# Patient Record
Sex: Male | Born: 1990 | Race: White | Hispanic: No | Marital: Single | Smoking: Never smoker
Health system: Southern US, Community
[De-identification: ages and names within clinical notes are randomized; demographics above are authoritative.]

## PROBLEM LIST (undated history)

## (undated) DIAGNOSIS — C8190 Hodgkin lymphoma, unspecified, unspecified site: Principal | ICD-10-CM

## (undated) DIAGNOSIS — F419 Anxiety disorder, unspecified: Secondary | ICD-10-CM

## (undated) HISTORY — DX: Anxiety disorder, unspecified: F41.9

## (undated) HISTORY — DX: Hodgkin lymphoma, unspecified, unspecified site: C81.90

---
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
## 2011-02-01 ENCOUNTER — Other Ambulatory Visit: Payer: Self-pay | Admitting: Family Medicine

## 2011-02-01 ENCOUNTER — Ambulatory Visit
Admission: RE | Admit: 2011-02-01 | Discharge: 2011-02-01 | Disposition: A | Discharge status: Home / Self Care | Payer: Federal, State, Local not specified - PPO | Source: Ambulatory Visit | Attending: Family Medicine | Admitting: Family Medicine

## 2011-02-01 DIAGNOSIS — R221 Localized swelling, mass and lump, neck: Secondary | ICD-10-CM

## 2011-02-05 ENCOUNTER — Ambulatory Visit (HOSPITAL_COMMUNITY)
Admission: RE | Admit: 2011-02-05 | Discharge: 2011-02-05 | Disposition: A | Discharge status: Home / Self Care | Payer: Federal, State, Local not specified - PPO | Source: Ambulatory Visit | Attending: General Surgery | Admitting: General Surgery

## 2011-02-05 ENCOUNTER — Other Ambulatory Visit (HOSPITAL_COMMUNITY): Payer: Self-pay | Admitting: General Surgery

## 2011-02-05 ENCOUNTER — Other Ambulatory Visit: Payer: Self-pay | Admitting: Interventional Radiology

## 2011-02-05 DIAGNOSIS — R221 Localized swelling, mass and lump, neck: Secondary | ICD-10-CM | POA: Insufficient documentation

## 2011-02-05 DIAGNOSIS — R599 Enlarged lymph nodes, unspecified: Secondary | ICD-10-CM

## 2011-02-05 DIAGNOSIS — R220 Localized swelling, mass and lump, head: Secondary | ICD-10-CM | POA: Insufficient documentation

## 2011-02-12 ENCOUNTER — Other Ambulatory Visit: Payer: Self-pay | Admitting: Oncology

## 2011-02-12 ENCOUNTER — Encounter (HOSPITAL_BASED_OUTPATIENT_CLINIC_OR_DEPARTMENT_OTHER): Payer: Federal, State, Local not specified - PPO | Admitting: Oncology

## 2011-02-12 DIAGNOSIS — C8171 Other classical Hodgkin lymphoma, lymph nodes of head, face, and neck: Secondary | ICD-10-CM

## 2011-02-12 DIAGNOSIS — C8190 Hodgkin lymphoma, unspecified, unspecified site: Secondary | ICD-10-CM

## 2011-02-12 LAB — CBC WITH DIFFERENTIAL/PLATELET
Eosinophils Absolute: 0.1 10*3/uL (ref 0.0–0.5)
HCT: 41.4 % (ref 38.4–49.9)
LYMPH%: 13.3 % — ABNORMAL LOW (ref 14.0–49.0)
MCV: 81.6 fL (ref 79.3–98.0)
MONO#: 0.7 10*3/uL (ref 0.1–0.9)
MONO%: 12.5 % (ref 0.0–14.0)
NEUT#: 4.3 10*3/uL (ref 1.5–6.5)
NEUT%: 72.1 % (ref 39.0–75.0)
Platelets: 349 10*3/uL (ref 140–400)
RBC: 5.07 10*6/uL (ref 4.20–5.82)
WBC: 5.9 10*3/uL (ref 4.0–10.3)

## 2011-02-12 LAB — MORPHOLOGY

## 2011-02-13 ENCOUNTER — Other Ambulatory Visit: Payer: Self-pay | Admitting: Oncology

## 2011-02-13 DIAGNOSIS — C8190 Hodgkin lymphoma, unspecified, unspecified site: Secondary | ICD-10-CM

## 2011-02-13 LAB — BETA 2 MICROGLOBULIN, SERUM: Beta-2 Microglobulin: 2.45 mg/L — ABNORMAL HIGH (ref 1.01–1.73)

## 2011-02-13 LAB — HIV ANTIBODY (ROUTINE TESTING W REFLEX): HIV: NONREACTIVE

## 2011-02-13 LAB — COMPREHENSIVE METABOLIC PANEL
ALT: 19 U/L (ref 0–53)
AST: 21 U/L (ref 0–37)
Calcium: 9.8 mg/dL (ref 8.4–10.5)
Chloride: 101 mEq/L (ref 96–112)
Creatinine, Ser: 0.85 mg/dL (ref 0.40–1.50)
Potassium: 4.2 mEq/L (ref 3.5–5.3)

## 2011-02-15 ENCOUNTER — Encounter: Payer: Federal, State, Local not specified - PPO | Admitting: Oncology

## 2011-02-15 ENCOUNTER — Inpatient Hospital Stay (HOSPITAL_COMMUNITY)
Admission: RE | Admit: 2011-02-15 | Discharge: 2011-02-15 | Disposition: A | Discharge status: Home / Self Care | Payer: Federal, State, Local not specified - PPO | Source: Ambulatory Visit | Attending: Oncology | Admitting: Oncology

## 2011-02-15 ENCOUNTER — Other Ambulatory Visit (HOSPITAL_COMMUNITY): Payer: Federal, State, Local not specified - PPO

## 2011-02-15 ENCOUNTER — Encounter (HOSPITAL_COMMUNITY): Payer: Self-pay

## 2011-02-15 ENCOUNTER — Encounter (HOSPITAL_COMMUNITY)
Admission: RE | Admit: 2011-02-15 | Discharge: 2011-02-15 | Disposition: A | Discharge status: Home / Self Care | Payer: Federal, State, Local not specified - PPO | Source: Ambulatory Visit | Attending: Oncology | Admitting: Oncology

## 2011-02-15 DIAGNOSIS — C8190 Hodgkin lymphoma, unspecified, unspecified site: Secondary | ICD-10-CM

## 2011-02-15 DIAGNOSIS — C8198 Hodgkin lymphoma, unspecified, lymph nodes of multiple sites: Secondary | ICD-10-CM | POA: Insufficient documentation

## 2011-02-15 DIAGNOSIS — J320 Chronic maxillary sinusitis: Secondary | ICD-10-CM | POA: Insufficient documentation

## 2011-02-15 DIAGNOSIS — R599 Enlarged lymph nodes, unspecified: Secondary | ICD-10-CM | POA: Insufficient documentation

## 2011-02-15 DIAGNOSIS — Z79899 Other long term (current) drug therapy: Secondary | ICD-10-CM | POA: Insufficient documentation

## 2011-02-15 HISTORY — DX: Hodgkin lymphoma, unspecified, unspecified site: C81.90

## 2011-02-15 LAB — GLUCOSE, CAPILLARY: Glucose-Capillary: 94 mg/dL (ref 70–99)

## 2011-02-15 MED ORDER — IOHEXOL 300 MG/ML  SOLN
125.0000 mL | Freq: Once | INTRAMUSCULAR | Status: AC | PRN
  Administered 2011-02-15: 125 mL via INTRAVENOUS

## 2011-02-18 ENCOUNTER — Other Ambulatory Visit (HOSPITAL_COMMUNITY)
Admission: RE | Admit: 2011-02-18 | Discharge: 2011-02-18 | Disposition: A | Discharge status: Home / Self Care | Payer: Federal, State, Local not specified - PPO | Source: Ambulatory Visit | Attending: Oncology | Admitting: Oncology

## 2011-02-18 ENCOUNTER — Ambulatory Visit (HOSPITAL_COMMUNITY)
Admission: RE | Admit: 2011-02-18 | Discharge: 2011-02-18 | Disposition: A | Discharge status: Home / Self Care | Payer: Federal, State, Local not specified - PPO | Source: Ambulatory Visit | Attending: Oncology | Admitting: Oncology

## 2011-02-18 ENCOUNTER — Encounter (HOSPITAL_BASED_OUTPATIENT_CLINIC_OR_DEPARTMENT_OTHER): Payer: Federal, State, Local not specified - PPO | Admitting: Oncology

## 2011-02-18 ENCOUNTER — Other Ambulatory Visit: Payer: Self-pay | Admitting: Oncology

## 2011-02-18 DIAGNOSIS — I428 Other cardiomyopathies: Secondary | ICD-10-CM | POA: Insufficient documentation

## 2011-02-18 DIAGNOSIS — C8191 Hodgkin lymphoma, unspecified, lymph nodes of head, face, and neck: Secondary | ICD-10-CM

## 2011-02-18 DIAGNOSIS — C8190 Hodgkin lymphoma, unspecified, unspecified site: Secondary | ICD-10-CM | POA: Insufficient documentation

## 2011-02-19 ENCOUNTER — Ambulatory Visit (HOSPITAL_COMMUNITY)
Admission: RE | Admit: 2011-02-19 | Discharge: 2011-02-19 | Disposition: A | Discharge status: Home / Self Care | Payer: Federal, State, Local not specified - PPO | Source: Ambulatory Visit | Attending: Oncology | Admitting: Oncology

## 2011-02-19 ENCOUNTER — Other Ambulatory Visit: Payer: Self-pay | Admitting: Oncology

## 2011-02-19 DIAGNOSIS — C8190 Hodgkin lymphoma, unspecified, unspecified site: Secondary | ICD-10-CM

## 2011-02-19 DIAGNOSIS — Z808 Family history of malignant neoplasm of other organs or systems: Secondary | ICD-10-CM | POA: Insufficient documentation

## 2011-02-19 DIAGNOSIS — Z801 Family history of malignant neoplasm of trachea, bronchus and lung: Secondary | ICD-10-CM | POA: Insufficient documentation

## 2011-02-19 DIAGNOSIS — Z803 Family history of malignant neoplasm of breast: Secondary | ICD-10-CM | POA: Insufficient documentation

## 2011-02-19 DIAGNOSIS — C8191 Hodgkin lymphoma, unspecified, lymph nodes of head, face, and neck: Secondary | ICD-10-CM | POA: Insufficient documentation

## 2011-02-20 ENCOUNTER — Other Ambulatory Visit (HOSPITAL_COMMUNITY): Payer: Federal, State, Local not specified - PPO

## 2011-02-20 ENCOUNTER — Ambulatory Visit (HOSPITAL_COMMUNITY): Payer: Federal, State, Local not specified - PPO

## 2011-02-22 ENCOUNTER — Encounter (HOSPITAL_BASED_OUTPATIENT_CLINIC_OR_DEPARTMENT_OTHER): Payer: Federal, State, Local not specified - PPO | Admitting: Oncology

## 2011-02-22 ENCOUNTER — Other Ambulatory Visit (HOSPITAL_COMMUNITY): Payer: Federal, State, Local not specified - PPO

## 2011-02-22 ENCOUNTER — Other Ambulatory Visit: Payer: Self-pay | Admitting: Oncology

## 2011-02-22 DIAGNOSIS — C8191 Hodgkin lymphoma, unspecified, lymph nodes of head, face, and neck: Secondary | ICD-10-CM

## 2011-02-22 DIAGNOSIS — C8171 Other classical Hodgkin lymphoma, lymph nodes of head, face, and neck: Secondary | ICD-10-CM

## 2011-02-22 DIAGNOSIS — Z5111 Encounter for antineoplastic chemotherapy: Secondary | ICD-10-CM

## 2011-02-22 LAB — COMPREHENSIVE METABOLIC PANEL
AST: 17 U/L (ref 0–37)
Alkaline Phosphatase: 77 U/L (ref 39–117)
BUN: 14 mg/dL (ref 6–23)
Creatinine, Ser: 0.73 mg/dL (ref 0.40–1.50)
Glucose, Bld: 88 mg/dL (ref 70–99)
Total Bilirubin: 0.8 mg/dL (ref 0.3–1.2)

## 2011-02-22 LAB — CBC WITH DIFFERENTIAL/PLATELET
BASO%: 0.6 % (ref 0.0–2.0)
Basophils Absolute: 0 10*3/uL (ref 0.0–0.1)
EOS%: 0.5 % (ref 0.0–7.0)
HCT: 42 % (ref 38.4–49.9)
HGB: 14.4 g/dL (ref 13.0–17.1)
LYMPH%: 11.2 % — ABNORMAL LOW (ref 14.0–49.0)
MCH: 26.8 pg — ABNORMAL LOW (ref 27.2–33.4)
MCHC: 34.3 g/dL (ref 32.0–36.0)
MCV: 78.1 fL — ABNORMAL LOW (ref 79.3–98.0)
MONO%: 17.3 % — ABNORMAL HIGH (ref 0.0–14.0)
NEUT%: 70.4 % (ref 39.0–75.0)
lymph#: 0.7 10*3/uL — ABNORMAL LOW (ref 0.9–3.3)

## 2011-02-22 LAB — PHOSPHORUS: Phosphorus: 3.6 mg/dL (ref 2.3–4.6)

## 2011-02-23 ENCOUNTER — Encounter: Payer: Federal, State, Local not specified - PPO | Admitting: Oncology

## 2011-02-23 DIAGNOSIS — Z5189 Encounter for other specified aftercare: Secondary | ICD-10-CM

## 2011-02-27 ENCOUNTER — Other Ambulatory Visit: Payer: Self-pay | Admitting: Oncology

## 2011-02-27 ENCOUNTER — Encounter (HOSPITAL_BASED_OUTPATIENT_CLINIC_OR_DEPARTMENT_OTHER): Payer: Federal, State, Local not specified - PPO | Admitting: Oncology

## 2011-02-27 DIAGNOSIS — C8171 Other classical Hodgkin lymphoma, lymph nodes of head, face, and neck: Secondary | ICD-10-CM

## 2011-02-27 LAB — BASIC METABOLIC PANEL
CO2: 28 mEq/L (ref 19–32)
Calcium: 9.1 mg/dL (ref 8.4–10.5)
Glucose, Bld: 141 mg/dL — ABNORMAL HIGH (ref 70–99)
Potassium: 4.1 mEq/L (ref 3.5–5.3)
Sodium: 138 mEq/L (ref 135–145)

## 2011-02-27 LAB — LACTATE DEHYDROGENASE: LDH: 158 U/L (ref 94–250)

## 2011-03-08 ENCOUNTER — Encounter (HOSPITAL_BASED_OUTPATIENT_CLINIC_OR_DEPARTMENT_OTHER): Payer: Federal, State, Local not specified - PPO | Admitting: Oncology

## 2011-03-08 ENCOUNTER — Other Ambulatory Visit: Payer: Self-pay | Admitting: Oncology

## 2011-03-08 DIAGNOSIS — C8191 Hodgkin lymphoma, unspecified, lymph nodes of head, face, and neck: Secondary | ICD-10-CM

## 2011-03-08 DIAGNOSIS — R112 Nausea with vomiting, unspecified: Secondary | ICD-10-CM

## 2011-03-08 DIAGNOSIS — D72829 Elevated white blood cell count, unspecified: Secondary | ICD-10-CM

## 2011-03-08 DIAGNOSIS — C8171 Other classical Hodgkin lymphoma, lymph nodes of head, face, and neck: Secondary | ICD-10-CM

## 2011-03-08 DIAGNOSIS — Z5111 Encounter for antineoplastic chemotherapy: Secondary | ICD-10-CM

## 2011-03-08 LAB — CBC WITH DIFFERENTIAL/PLATELET
BASO%: 1.2 % (ref 0.0–2.0)
Eosinophils Absolute: 0.5 10*3/uL (ref 0.0–0.5)
HCT: 40.3 % (ref 38.4–49.9)
LYMPH%: 9.1 % — ABNORMAL LOW (ref 14.0–49.0)
MCHC: 33.7 g/dL (ref 32.0–36.0)
MONO#: 1.3 10*3/uL — ABNORMAL HIGH (ref 0.1–0.9)
NEUT#: 9.8 10*3/uL — ABNORMAL HIGH (ref 1.5–6.5)
NEUT%: 76 % — ABNORMAL HIGH (ref 39.0–75.0)
Platelets: 159 10*3/uL (ref 140–400)
RBC: 5.08 10*6/uL (ref 4.20–5.82)
WBC: 12.9 10*3/uL — ABNORMAL HIGH (ref 4.0–10.3)
lymph#: 1.2 10*3/uL (ref 0.9–3.3)
nRBC: 0 % (ref 0–0)

## 2011-03-08 LAB — COMPREHENSIVE METABOLIC PANEL
ALT: 91 U/L — ABNORMAL HIGH (ref 0–53)
AST: 46 U/L — ABNORMAL HIGH (ref 0–37)
Albumin: 4.5 g/dL (ref 3.5–5.2)
CO2: 25 mEq/L (ref 19–32)
Calcium: 9.2 mg/dL (ref 8.4–10.5)
Chloride: 105 mEq/L (ref 96–112)
Potassium: 4.2 mEq/L (ref 3.5–5.3)

## 2011-03-08 LAB — URIC ACID: Uric Acid, Serum: 2.6 mg/dL — ABNORMAL LOW (ref 4.0–7.8)

## 2011-03-09 ENCOUNTER — Encounter (HOSPITAL_BASED_OUTPATIENT_CLINIC_OR_DEPARTMENT_OTHER): Payer: Federal, State, Local not specified - PPO | Admitting: Oncology

## 2011-03-09 DIAGNOSIS — C8191 Hodgkin lymphoma, unspecified, lymph nodes of head, face, and neck: Secondary | ICD-10-CM

## 2011-03-09 DIAGNOSIS — Z5189 Encounter for other specified aftercare: Secondary | ICD-10-CM

## 2011-03-13 ENCOUNTER — Encounter (HOSPITAL_BASED_OUTPATIENT_CLINIC_OR_DEPARTMENT_OTHER): Payer: Federal, State, Local not specified - PPO | Admitting: Oncology

## 2011-03-13 ENCOUNTER — Other Ambulatory Visit: Payer: Self-pay | Admitting: Oncology

## 2011-03-13 DIAGNOSIS — C8171 Other classical Hodgkin lymphoma, lymph nodes of head, face, and neck: Secondary | ICD-10-CM

## 2011-03-13 LAB — PHOSPHORUS: Phosphorus: 4.5 mg/dL (ref 2.3–4.6)

## 2011-03-13 LAB — BASIC METABOLIC PANEL
BUN: 16 mg/dL (ref 6–23)
Calcium: 9.5 mg/dL (ref 8.4–10.5)
Creatinine, Ser: 0.7 mg/dL (ref 0.40–1.50)
Glucose, Bld: 86 mg/dL (ref 70–99)

## 2011-03-13 LAB — URIC ACID: Uric Acid, Serum: 2.5 mg/dL — ABNORMAL LOW (ref 4.0–7.8)

## 2011-03-22 ENCOUNTER — Other Ambulatory Visit: Payer: Self-pay | Admitting: Oncology

## 2011-03-22 ENCOUNTER — Encounter (HOSPITAL_BASED_OUTPATIENT_CLINIC_OR_DEPARTMENT_OTHER): Payer: Federal, State, Local not specified - PPO | Admitting: Oncology

## 2011-03-22 DIAGNOSIS — Z5111 Encounter for antineoplastic chemotherapy: Secondary | ICD-10-CM

## 2011-03-22 DIAGNOSIS — C8171 Other classical Hodgkin lymphoma, lymph nodes of head, face, and neck: Secondary | ICD-10-CM

## 2011-03-22 DIAGNOSIS — C8191 Hodgkin lymphoma, unspecified, lymph nodes of head, face, and neck: Secondary | ICD-10-CM

## 2011-03-22 LAB — CBC WITH DIFFERENTIAL/PLATELET
EOS%: 2.6 % (ref 0.0–7.0)
Eosinophils Absolute: 0.4 10*3/uL (ref 0.0–0.5)
LYMPH%: 9.3 % — ABNORMAL LOW (ref 14.0–49.0)
MCH: 27.3 pg (ref 27.2–33.4)
MCHC: 34.8 g/dL (ref 32.0–36.0)
MCV: 78.4 fL — ABNORMAL LOW (ref 79.3–98.0)
MONO%: 7.3 % (ref 0.0–14.0)
Platelets: 197 10*3/uL (ref 140–400)
RBC: 4.91 10*6/uL (ref 4.20–5.82)
nRBC: 0 % (ref 0–0)

## 2011-03-23 ENCOUNTER — Encounter (HOSPITAL_BASED_OUTPATIENT_CLINIC_OR_DEPARTMENT_OTHER): Payer: Federal, State, Local not specified - PPO | Admitting: Oncology

## 2011-03-23 DIAGNOSIS — Z5189 Encounter for other specified aftercare: Secondary | ICD-10-CM

## 2011-03-23 DIAGNOSIS — C8191 Hodgkin lymphoma, unspecified, lymph nodes of head, face, and neck: Secondary | ICD-10-CM

## 2011-03-25 LAB — COMPREHENSIVE METABOLIC PANEL
AST: 30 U/L (ref 0–37)
BUN: 15 mg/dL (ref 6–23)
Calcium: 9.6 mg/dL (ref 8.4–10.5)
Chloride: 104 mEq/L (ref 96–112)
Creatinine, Ser: 0.73 mg/dL (ref 0.50–1.35)
Total Bilirubin: 0.3 mg/dL (ref 0.3–1.2)

## 2011-03-30 ENCOUNTER — Inpatient Hospital Stay (INDEPENDENT_AMBULATORY_CARE_PROVIDER_SITE_OTHER)
Admission: RE | Admit: 2011-03-30 | Discharge: 2011-03-30 | Disposition: A | Discharge status: Home / Self Care | Payer: Federal, State, Local not specified - PPO | Source: Ambulatory Visit | Attending: Emergency Medicine | Admitting: Emergency Medicine

## 2011-03-30 DIAGNOSIS — R300 Dysuria: Secondary | ICD-10-CM

## 2011-03-30 LAB — POCT URINALYSIS DIP (DEVICE)
Bilirubin Urine: NEGATIVE
Hgb urine dipstick: NEGATIVE
Ketones, ur: NEGATIVE mg/dL
Specific Gravity, Urine: 1.02 (ref 1.005–1.030)
pH: 6 (ref 5.0–8.0)

## 2011-04-05 ENCOUNTER — Other Ambulatory Visit: Payer: Self-pay | Admitting: Oncology

## 2011-04-05 ENCOUNTER — Encounter (HOSPITAL_BASED_OUTPATIENT_CLINIC_OR_DEPARTMENT_OTHER): Payer: Federal, State, Local not specified - PPO | Admitting: Oncology

## 2011-04-05 DIAGNOSIS — C8191 Hodgkin lymphoma, unspecified, lymph nodes of head, face, and neck: Secondary | ICD-10-CM

## 2011-04-05 DIAGNOSIS — D72829 Elevated white blood cell count, unspecified: Secondary | ICD-10-CM

## 2011-04-05 DIAGNOSIS — C8190 Hodgkin lymphoma, unspecified, unspecified site: Secondary | ICD-10-CM

## 2011-04-05 DIAGNOSIS — Z5111 Encounter for antineoplastic chemotherapy: Secondary | ICD-10-CM

## 2011-04-05 DIAGNOSIS — C8171 Other classical Hodgkin lymphoma, lymph nodes of head, face, and neck: Secondary | ICD-10-CM

## 2011-04-05 DIAGNOSIS — R112 Nausea with vomiting, unspecified: Secondary | ICD-10-CM

## 2011-04-05 LAB — COMPREHENSIVE METABOLIC PANEL
ALT: 24 U/L (ref 0–53)
BUN: 14 mg/dL (ref 6–23)
CO2: 28 mEq/L (ref 19–32)
Calcium: 9.2 mg/dL (ref 8.4–10.5)
Chloride: 104 mEq/L (ref 96–112)
Creatinine, Ser: 0.69 mg/dL (ref 0.50–1.35)
Total Bilirubin: 0.4 mg/dL (ref 0.3–1.2)

## 2011-04-05 LAB — URIC ACID: Uric Acid, Serum: 2.8 mg/dL — ABNORMAL LOW (ref 4.0–7.8)

## 2011-04-05 LAB — CBC WITH DIFFERENTIAL/PLATELET
BASO%: 1.3 % (ref 0.0–2.0)
HCT: 38.4 % (ref 38.4–49.9)
LYMPH%: 10 % — ABNORMAL LOW (ref 14.0–49.0)
MCHC: 34.4 g/dL (ref 32.0–36.0)
MCV: 80.7 fL (ref 79.3–98.0)
MONO#: 1.1 10*3/uL — ABNORMAL HIGH (ref 0.1–0.9)
MONO%: 8 % (ref 0.0–14.0)
NEUT%: 75.9 % — ABNORMAL HIGH (ref 39.0–75.0)
Platelets: 229 10*3/uL (ref 140–400)
RBC: 4.76 10*6/uL (ref 4.20–5.82)
nRBC: 0 % (ref 0–0)

## 2011-04-05 LAB — PHOSPHORUS: Phosphorus: 3.7 mg/dL (ref 2.3–4.6)

## 2011-04-06 ENCOUNTER — Encounter (HOSPITAL_BASED_OUTPATIENT_CLINIC_OR_DEPARTMENT_OTHER): Payer: Federal, State, Local not specified - PPO | Admitting: Oncology

## 2011-04-06 DIAGNOSIS — D72829 Elevated white blood cell count, unspecified: Secondary | ICD-10-CM

## 2011-04-06 DIAGNOSIS — R112 Nausea with vomiting, unspecified: Secondary | ICD-10-CM

## 2011-04-06 DIAGNOSIS — C8191 Hodgkin lymphoma, unspecified, lymph nodes of head, face, and neck: Secondary | ICD-10-CM

## 2011-04-06 DIAGNOSIS — Z5111 Encounter for antineoplastic chemotherapy: Secondary | ICD-10-CM

## 2011-04-18 ENCOUNTER — Encounter (HOSPITAL_COMMUNITY)
Admission: RE | Admit: 2011-04-18 | Discharge: 2011-04-18 | Disposition: A | Discharge status: Home / Self Care | Payer: Federal, State, Local not specified - PPO | Source: Ambulatory Visit | Attending: Oncology | Admitting: Oncology

## 2011-04-18 ENCOUNTER — Encounter (HOSPITAL_COMMUNITY): Payer: Self-pay

## 2011-04-18 DIAGNOSIS — Z79899 Other long term (current) drug therapy: Secondary | ICD-10-CM | POA: Insufficient documentation

## 2011-04-18 DIAGNOSIS — C8190 Hodgkin lymphoma, unspecified, unspecified site: Secondary | ICD-10-CM | POA: Insufficient documentation

## 2011-04-18 DIAGNOSIS — L089 Local infection of the skin and subcutaneous tissue, unspecified: Secondary | ICD-10-CM | POA: Insufficient documentation

## 2011-04-18 LAB — GLUCOSE, CAPILLARY: Glucose-Capillary: 95 mg/dL (ref 70–99)

## 2011-04-18 MED ORDER — FLUDEOXYGLUCOSE F - 18 (FDG) INJECTION
16.4000 | Freq: Once | INTRAVENOUS | Status: AC | PRN
  Administered 2011-04-18: 16.4 via INTRAVENOUS

## 2011-04-19 ENCOUNTER — Other Ambulatory Visit: Payer: Self-pay | Admitting: Oncology

## 2011-04-19 ENCOUNTER — Encounter (HOSPITAL_BASED_OUTPATIENT_CLINIC_OR_DEPARTMENT_OTHER): Payer: Federal, State, Local not specified - PPO | Admitting: Oncology

## 2011-04-19 DIAGNOSIS — C8191 Hodgkin lymphoma, unspecified, lymph nodes of head, face, and neck: Secondary | ICD-10-CM

## 2011-04-19 DIAGNOSIS — R112 Nausea with vomiting, unspecified: Secondary | ICD-10-CM

## 2011-04-19 DIAGNOSIS — Z5111 Encounter for antineoplastic chemotherapy: Secondary | ICD-10-CM

## 2011-04-19 DIAGNOSIS — D72829 Elevated white blood cell count, unspecified: Secondary | ICD-10-CM

## 2011-04-19 LAB — CBC WITH DIFFERENTIAL/PLATELET
BASO%: 0.5 % (ref 0.0–2.0)
Eosinophils Absolute: 0.6 10*3/uL — ABNORMAL HIGH (ref 0.0–0.5)
LYMPH%: 7.9 % — ABNORMAL LOW (ref 14.0–49.0)
MONO#: 0.7 10*3/uL (ref 0.1–0.9)
NEUT#: 10.5 10*3/uL — ABNORMAL HIGH (ref 1.5–6.5)
Platelets: 250 10*3/uL (ref 140–400)
RBC: 4.27 10*6/uL (ref 4.20–5.82)
RDW: 22.7 % — ABNORMAL HIGH (ref 11.0–14.6)
WBC: 12.9 10*3/uL — ABNORMAL HIGH (ref 4.0–10.3)
lymph#: 1 10*3/uL (ref 0.9–3.3)

## 2011-04-19 LAB — COMPREHENSIVE METABOLIC PANEL
AST: 20 U/L (ref 0–37)
Alkaline Phosphatase: 65 U/L (ref 39–117)
CO2: 25 mEq/L (ref 19–32)
Calcium: 9.4 mg/dL (ref 8.4–10.5)
Chloride: 105 mEq/L (ref 96–112)
Glucose, Bld: 93 mg/dL (ref 70–99)
Potassium: 4.1 mEq/L (ref 3.5–5.3)
Sodium: 139 mEq/L (ref 135–145)
Total Bilirubin: 0.4 mg/dL (ref 0.3–1.2)

## 2011-04-20 ENCOUNTER — Encounter (HOSPITAL_BASED_OUTPATIENT_CLINIC_OR_DEPARTMENT_OTHER): Payer: Federal, State, Local not specified - PPO | Admitting: Oncology

## 2011-04-20 DIAGNOSIS — Z5189 Encounter for other specified aftercare: Secondary | ICD-10-CM

## 2011-04-20 DIAGNOSIS — C8191 Hodgkin lymphoma, unspecified, lymph nodes of head, face, and neck: Secondary | ICD-10-CM

## 2011-04-26 ENCOUNTER — Encounter (HOSPITAL_COMMUNITY): Payer: Federal, State, Local not specified - PPO

## 2011-04-26 ENCOUNTER — Ambulatory Visit (HOSPITAL_COMMUNITY)
Admission: RE | Admit: 2011-04-26 | Discharge: 2011-04-26 | Disposition: A | Discharge status: Home / Self Care | Payer: Federal, State, Local not specified - PPO | Source: Ambulatory Visit | Attending: Oncology | Admitting: Oncology

## 2011-04-26 DIAGNOSIS — R059 Cough, unspecified: Secondary | ICD-10-CM | POA: Insufficient documentation

## 2011-04-26 DIAGNOSIS — C8190 Hodgkin lymphoma, unspecified, unspecified site: Secondary | ICD-10-CM | POA: Insufficient documentation

## 2011-04-26 DIAGNOSIS — R05 Cough: Secondary | ICD-10-CM | POA: Insufficient documentation

## 2011-05-03 ENCOUNTER — Encounter (HOSPITAL_BASED_OUTPATIENT_CLINIC_OR_DEPARTMENT_OTHER): Payer: Federal, State, Local not specified - PPO | Admitting: Oncology

## 2011-05-03 ENCOUNTER — Other Ambulatory Visit: Payer: Self-pay | Admitting: Oncology

## 2011-05-03 DIAGNOSIS — C8191 Hodgkin lymphoma, unspecified, lymph nodes of head, face, and neck: Secondary | ICD-10-CM

## 2011-05-03 DIAGNOSIS — D72829 Elevated white blood cell count, unspecified: Secondary | ICD-10-CM

## 2011-05-03 DIAGNOSIS — Z5111 Encounter for antineoplastic chemotherapy: Secondary | ICD-10-CM

## 2011-05-03 DIAGNOSIS — R112 Nausea with vomiting, unspecified: Secondary | ICD-10-CM

## 2011-05-03 LAB — COMPREHENSIVE METABOLIC PANEL
AST: 18 U/L (ref 0–37)
BUN: 16 mg/dL (ref 6–23)
Calcium: 9.2 mg/dL (ref 8.4–10.5)
Chloride: 107 mEq/L (ref 96–112)
Creatinine, Ser: 0.92 mg/dL (ref 0.50–1.35)

## 2011-05-03 LAB — CBC WITH DIFFERENTIAL/PLATELET
Basophils Absolute: 0 10*3/uL (ref 0.0–0.1)
EOS%: 5.2 % (ref 0.0–7.0)
HCT: 38.1 % — ABNORMAL LOW (ref 38.4–49.9)
HGB: 13.2 g/dL (ref 13.0–17.1)
MCH: 29.5 pg (ref 27.2–33.4)
MCV: 85.3 fL (ref 79.3–98.0)
NEUT%: 78.8 % — ABNORMAL HIGH (ref 39.0–75.0)
Platelets: 243 10*3/uL (ref 140–400)
lymph#: 0.8 10*3/uL — ABNORMAL LOW (ref 0.9–3.3)

## 2011-05-04 ENCOUNTER — Encounter: Payer: Federal, State, Local not specified - PPO | Admitting: Oncology

## 2011-05-04 DIAGNOSIS — IMO0002 Reserved for concepts with insufficient information to code with codable children: Secondary | ICD-10-CM

## 2011-05-17 ENCOUNTER — Encounter (HOSPITAL_BASED_OUTPATIENT_CLINIC_OR_DEPARTMENT_OTHER): Payer: Federal, State, Local not specified - PPO | Admitting: Oncology

## 2011-05-17 ENCOUNTER — Other Ambulatory Visit: Payer: Self-pay | Admitting: Oncology

## 2011-05-17 DIAGNOSIS — Z5111 Encounter for antineoplastic chemotherapy: Secondary | ICD-10-CM

## 2011-05-17 DIAGNOSIS — D72829 Elevated white blood cell count, unspecified: Secondary | ICD-10-CM

## 2011-05-17 DIAGNOSIS — C8191 Hodgkin lymphoma, unspecified, lymph nodes of head, face, and neck: Secondary | ICD-10-CM

## 2011-05-17 DIAGNOSIS — I519 Heart disease, unspecified: Secondary | ICD-10-CM

## 2011-05-17 DIAGNOSIS — R112 Nausea with vomiting, unspecified: Secondary | ICD-10-CM

## 2011-05-17 LAB — COMPREHENSIVE METABOLIC PANEL
Albumin: 4.5 g/dL (ref 3.5–5.2)
BUN: 17 mg/dL (ref 6–23)
CO2: 25 mEq/L (ref 19–32)
Calcium: 9.3 mg/dL (ref 8.4–10.5)
Chloride: 108 mEq/L (ref 96–112)
Glucose, Bld: 102 mg/dL — ABNORMAL HIGH (ref 70–99)
Potassium: 4.1 mEq/L (ref 3.5–5.3)

## 2011-05-17 LAB — CBC WITH DIFFERENTIAL/PLATELET
Basophils Absolute: 0.1 10*3/uL (ref 0.0–0.1)
HCT: 38.5 % (ref 38.4–49.9)
HGB: 13.2 g/dL (ref 13.0–17.1)
MCH: 28.9 pg (ref 27.2–33.4)
MONO#: 0.8 10*3/uL (ref 0.1–0.9)
NEUT%: 74 % (ref 39.0–75.0)
WBC: 11.1 10*3/uL — ABNORMAL HIGH (ref 4.0–10.3)
lymph#: 1.1 10*3/uL (ref 0.9–3.3)

## 2011-05-18 ENCOUNTER — Encounter (HOSPITAL_BASED_OUTPATIENT_CLINIC_OR_DEPARTMENT_OTHER): Payer: Federal, State, Local not specified - PPO | Admitting: Oncology

## 2011-05-18 DIAGNOSIS — C8191 Hodgkin lymphoma, unspecified, lymph nodes of head, face, and neck: Secondary | ICD-10-CM

## 2011-05-31 ENCOUNTER — Other Ambulatory Visit: Payer: Self-pay | Admitting: Oncology

## 2011-05-31 ENCOUNTER — Encounter (HOSPITAL_BASED_OUTPATIENT_CLINIC_OR_DEPARTMENT_OTHER): Payer: Federal, State, Local not specified - PPO | Admitting: Oncology

## 2011-05-31 DIAGNOSIS — Z5111 Encounter for antineoplastic chemotherapy: Secondary | ICD-10-CM

## 2011-05-31 DIAGNOSIS — C8191 Hodgkin lymphoma, unspecified, lymph nodes of head, face, and neck: Secondary | ICD-10-CM

## 2011-05-31 LAB — COMPREHENSIVE METABOLIC PANEL
AST: 19 U/L (ref 0–37)
Albumin: 4.5 g/dL (ref 3.5–5.2)
BUN: 13 mg/dL (ref 6–23)
Calcium: 9.4 mg/dL (ref 8.4–10.5)
Chloride: 105 mEq/L (ref 96–112)
Potassium: 3.9 mEq/L (ref 3.5–5.3)
Sodium: 140 mEq/L (ref 135–145)
Total Protein: 6.3 g/dL (ref 6.0–8.3)

## 2011-05-31 LAB — CBC WITH DIFFERENTIAL/PLATELET
Basophils Absolute: 0.1 10*3/uL (ref 0.0–0.1)
EOS%: 5.6 % (ref 0.0–7.0)
Eosinophils Absolute: 0.6 10*3/uL — ABNORMAL HIGH (ref 0.0–0.5)
HGB: 13.7 g/dL (ref 13.0–17.1)
NEUT#: 8.6 10*3/uL — ABNORMAL HIGH (ref 1.5–6.5)
RDW: 17.7 % — ABNORMAL HIGH (ref 11.0–14.6)
lymph#: 0.9 10*3/uL (ref 0.9–3.3)

## 2011-06-01 ENCOUNTER — Encounter: Payer: Federal, State, Local not specified - PPO | Admitting: Oncology

## 2011-06-14 ENCOUNTER — Other Ambulatory Visit: Payer: Self-pay | Admitting: Oncology

## 2011-06-14 ENCOUNTER — Encounter (HOSPITAL_BASED_OUTPATIENT_CLINIC_OR_DEPARTMENT_OTHER): Payer: Federal, State, Local not specified - PPO | Admitting: Oncology

## 2011-06-14 DIAGNOSIS — D72829 Elevated white blood cell count, unspecified: Secondary | ICD-10-CM

## 2011-06-14 DIAGNOSIS — C8191 Hodgkin lymphoma, unspecified, lymph nodes of head, face, and neck: Secondary | ICD-10-CM

## 2011-06-14 DIAGNOSIS — Z5111 Encounter for antineoplastic chemotherapy: Secondary | ICD-10-CM

## 2011-06-14 DIAGNOSIS — R112 Nausea with vomiting, unspecified: Secondary | ICD-10-CM

## 2011-06-14 LAB — CBC WITH DIFFERENTIAL/PLATELET
BASO%: 1 % (ref 0.0–2.0)
Basophils Absolute: 0.1 10*3/uL (ref 0.0–0.1)
EOS%: 3.8 % (ref 0.0–7.0)
HGB: 12.2 g/dL — ABNORMAL LOW (ref 13.0–17.1)
MCH: 29.5 pg (ref 27.2–33.4)
MCHC: 34.1 g/dL (ref 32.0–36.0)
MONO%: 12.5 % (ref 0.0–14.0)
RBC: 4.13 10*6/uL — ABNORMAL LOW (ref 4.20–5.82)
RDW: 16.3 % — ABNORMAL HIGH (ref 11.0–14.6)
lymph#: 0.6 10*3/uL — ABNORMAL LOW (ref 0.9–3.3)
nRBC: 0 % (ref 0–0)

## 2011-06-14 LAB — COMPREHENSIVE METABOLIC PANEL
ALT: 20 U/L (ref 0–53)
AST: 33 U/L (ref 0–37)
Albumin: 4.3 g/dL (ref 3.5–5.2)
Alkaline Phosphatase: 66 U/L (ref 39–117)
Potassium: 3.9 mEq/L (ref 3.5–5.3)
Sodium: 138 mEq/L (ref 135–145)
Total Bilirubin: 0.5 mg/dL (ref 0.3–1.2)
Total Protein: 6.3 g/dL (ref 6.0–8.3)

## 2011-06-15 ENCOUNTER — Encounter (HOSPITAL_BASED_OUTPATIENT_CLINIC_OR_DEPARTMENT_OTHER): Payer: Federal, State, Local not specified - PPO | Admitting: Oncology

## 2011-06-15 DIAGNOSIS — Z5189 Encounter for other specified aftercare: Secondary | ICD-10-CM

## 2011-06-15 DIAGNOSIS — C8191 Hodgkin lymphoma, unspecified, lymph nodes of head, face, and neck: Secondary | ICD-10-CM

## 2011-06-28 ENCOUNTER — Other Ambulatory Visit: Payer: Self-pay | Admitting: Oncology

## 2011-06-28 ENCOUNTER — Encounter (HOSPITAL_BASED_OUTPATIENT_CLINIC_OR_DEPARTMENT_OTHER): Payer: Federal, State, Local not specified - PPO | Admitting: Oncology

## 2011-06-28 DIAGNOSIS — R112 Nausea with vomiting, unspecified: Secondary | ICD-10-CM

## 2011-06-28 DIAGNOSIS — Z5111 Encounter for antineoplastic chemotherapy: Secondary | ICD-10-CM

## 2011-06-28 DIAGNOSIS — D72829 Elevated white blood cell count, unspecified: Secondary | ICD-10-CM

## 2011-06-28 DIAGNOSIS — C8191 Hodgkin lymphoma, unspecified, lymph nodes of head, face, and neck: Secondary | ICD-10-CM

## 2011-06-28 LAB — COMPREHENSIVE METABOLIC PANEL
AST: 33 U/L (ref 0–37)
Albumin: 4.3 g/dL (ref 3.5–5.2)
Alkaline Phosphatase: 62 U/L (ref 39–117)
BUN: 15 mg/dL (ref 6–23)
Glucose, Bld: 118 mg/dL — ABNORMAL HIGH (ref 70–99)
Potassium: 4 mEq/L (ref 3.5–5.3)
Sodium: 137 mEq/L (ref 135–145)
Total Bilirubin: 0.5 mg/dL (ref 0.3–1.2)

## 2011-06-28 LAB — CBC WITH DIFFERENTIAL/PLATELET
EOS%: 3.9 % (ref 0.0–7.0)
Eosinophils Absolute: 0.4 10*3/uL (ref 0.0–0.5)
LYMPH%: 9.3 % — ABNORMAL LOW (ref 14.0–49.0)
MCH: 31.3 pg (ref 27.2–33.4)
MCV: 89.9 fL (ref 79.3–98.0)
MONO%: 7.7 % (ref 0.0–14.0)
Platelets: 216 10*3/uL (ref 140–400)
RBC: 3.88 10*6/uL — ABNORMAL LOW (ref 4.20–5.82)
RDW: 14.2 % (ref 11.0–14.6)

## 2011-06-29 ENCOUNTER — Encounter (HOSPITAL_BASED_OUTPATIENT_CLINIC_OR_DEPARTMENT_OTHER): Payer: Federal, State, Local not specified - PPO | Admitting: Oncology

## 2011-06-29 DIAGNOSIS — Z5189 Encounter for other specified aftercare: Secondary | ICD-10-CM

## 2011-06-29 DIAGNOSIS — C8191 Hodgkin lymphoma, unspecified, lymph nodes of head, face, and neck: Secondary | ICD-10-CM

## 2011-07-12 ENCOUNTER — Other Ambulatory Visit: Payer: Self-pay | Admitting: Oncology

## 2011-07-12 ENCOUNTER — Encounter (HOSPITAL_BASED_OUTPATIENT_CLINIC_OR_DEPARTMENT_OTHER): Payer: Federal, State, Local not specified - PPO | Admitting: Oncology

## 2011-07-12 DIAGNOSIS — D6481 Anemia due to antineoplastic chemotherapy: Secondary | ICD-10-CM

## 2011-07-12 DIAGNOSIS — C8191 Hodgkin lymphoma, unspecified, lymph nodes of head, face, and neck: Secondary | ICD-10-CM

## 2011-07-12 DIAGNOSIS — D72829 Elevated white blood cell count, unspecified: Secondary | ICD-10-CM

## 2011-07-12 DIAGNOSIS — Z5111 Encounter for antineoplastic chemotherapy: Secondary | ICD-10-CM

## 2011-07-12 DIAGNOSIS — R112 Nausea with vomiting, unspecified: Secondary | ICD-10-CM

## 2011-07-12 DIAGNOSIS — T451X5A Adverse effect of antineoplastic and immunosuppressive drugs, initial encounter: Secondary | ICD-10-CM

## 2011-07-12 LAB — CBC WITH DIFFERENTIAL/PLATELET
Basophils Absolute: 0.1 10*3/uL (ref 0.0–0.1)
Eosinophils Absolute: 0.5 10*3/uL (ref 0.0–0.5)
HCT: 38.1 % — ABNORMAL LOW (ref 38.4–49.9)
LYMPH%: 5.8 % — ABNORMAL LOW (ref 14.0–49.0)
MCV: 88.2 fL (ref 79.3–98.0)
MONO#: 1.5 10*3/uL — ABNORMAL HIGH (ref 0.1–0.9)
MONO%: 11.7 % (ref 0.0–14.0)
NEUT#: 9.9 10*3/uL — ABNORMAL HIGH (ref 1.5–6.5)
NEUT%: 78 % — ABNORMAL HIGH (ref 39.0–75.0)
Platelets: 242 10*3/uL (ref 140–400)
WBC: 12.7 10*3/uL — ABNORMAL HIGH (ref 4.0–10.3)

## 2011-07-12 LAB — COMPREHENSIVE METABOLIC PANEL
Alkaline Phosphatase: 62 U/L (ref 39–117)
BUN: 11 mg/dL (ref 6–23)
CO2: 26 mEq/L (ref 19–32)
Creatinine, Ser: 0.81 mg/dL (ref 0.50–1.35)
Glucose, Bld: 104 mg/dL — ABNORMAL HIGH (ref 70–99)
Total Bilirubin: 0.5 mg/dL (ref 0.3–1.2)
Total Protein: 6.2 g/dL (ref 6.0–8.3)

## 2011-07-12 LAB — LACTATE DEHYDROGENASE: LDH: 198 U/L (ref 94–250)

## 2011-07-13 ENCOUNTER — Encounter: Payer: Federal, State, Local not specified - PPO | Admitting: Oncology

## 2011-07-13 DIAGNOSIS — C8190 Hodgkin lymphoma, unspecified, unspecified site: Secondary | ICD-10-CM

## 2011-07-13 DIAGNOSIS — Z5189 Encounter for other specified aftercare: Secondary | ICD-10-CM

## 2011-07-26 ENCOUNTER — Other Ambulatory Visit: Payer: Self-pay | Admitting: Oncology

## 2011-07-26 ENCOUNTER — Encounter (HOSPITAL_BASED_OUTPATIENT_CLINIC_OR_DEPARTMENT_OTHER): Payer: Federal, State, Local not specified - PPO | Admitting: Oncology

## 2011-07-26 DIAGNOSIS — D72829 Elevated white blood cell count, unspecified: Secondary | ICD-10-CM

## 2011-07-26 DIAGNOSIS — C8190 Hodgkin lymphoma, unspecified, unspecified site: Secondary | ICD-10-CM

## 2011-07-26 DIAGNOSIS — C8191 Hodgkin lymphoma, unspecified, lymph nodes of head, face, and neck: Secondary | ICD-10-CM

## 2011-07-26 DIAGNOSIS — Z5111 Encounter for antineoplastic chemotherapy: Secondary | ICD-10-CM

## 2011-07-26 DIAGNOSIS — R112 Nausea with vomiting, unspecified: Secondary | ICD-10-CM

## 2011-07-26 LAB — CBC WITH DIFFERENTIAL/PLATELET
BASO%: 0.9 % (ref 0.0–2.0)
Eosinophils Absolute: 0.7 10*3/uL — ABNORMAL HIGH (ref 0.0–0.5)
HCT: 37.7 % — ABNORMAL LOW (ref 38.4–49.9)
HGB: 12.8 g/dL — ABNORMAL LOW (ref 13.0–17.1)
MCHC: 34 g/dL (ref 32.0–36.0)
MONO#: 1 10*3/uL — ABNORMAL HIGH (ref 0.1–0.9)
NEUT#: 7.9 10*3/uL — ABNORMAL HIGH (ref 1.5–6.5)
NEUT%: 74.8 % (ref 39.0–75.0)
WBC: 10.5 10*3/uL — ABNORMAL HIGH (ref 4.0–10.3)
lymph#: 0.9 10*3/uL (ref 0.9–3.3)

## 2011-07-26 LAB — COMPREHENSIVE METABOLIC PANEL
ALT: 13 U/L (ref 0–53)
AST: 18 U/L (ref 0–37)
Albumin: 4.4 g/dL (ref 3.5–5.2)
BUN: 11 mg/dL (ref 6–23)
CO2: 23 mEq/L (ref 19–32)
Calcium: 9 mg/dL (ref 8.4–10.5)
Chloride: 105 mEq/L (ref 96–112)
Potassium: 4.3 mEq/L (ref 3.5–5.3)

## 2011-07-27 ENCOUNTER — Encounter: Payer: Federal, State, Local not specified - PPO | Admitting: Oncology

## 2011-07-27 DIAGNOSIS — Z5189 Encounter for other specified aftercare: Secondary | ICD-10-CM

## 2011-08-08 ENCOUNTER — Encounter (HOSPITAL_COMMUNITY)
Admission: RE | Admit: 2011-08-08 | Discharge: 2011-08-08 | Disposition: A | Discharge status: Home / Self Care | Payer: Federal, State, Local not specified - PPO | Source: Ambulatory Visit | Attending: Oncology | Admitting: Oncology

## 2011-08-08 ENCOUNTER — Encounter (HOSPITAL_COMMUNITY): Payer: Self-pay

## 2011-08-08 DIAGNOSIS — C8190 Hodgkin lymphoma, unspecified, unspecified site: Secondary | ICD-10-CM | POA: Insufficient documentation

## 2011-08-08 LAB — GLUCOSE, CAPILLARY: Glucose-Capillary: 106 mg/dL — ABNORMAL HIGH (ref 70–99)

## 2011-08-08 MED ORDER — FLUDEOXYGLUCOSE F - 18 (FDG) INJECTION
18.5000 | Freq: Once | INTRAVENOUS | Status: AC | PRN
  Administered 2011-08-08: 18.5 via INTRAVENOUS

## 2011-08-09 ENCOUNTER — Ambulatory Visit (HOSPITAL_BASED_OUTPATIENT_CLINIC_OR_DEPARTMENT_OTHER): Payer: Federal, State, Local not specified - PPO | Admitting: Oncology

## 2011-08-09 ENCOUNTER — Other Ambulatory Visit: Payer: Self-pay | Admitting: Oncology

## 2011-08-09 DIAGNOSIS — D72829 Elevated white blood cell count, unspecified: Secondary | ICD-10-CM

## 2011-08-09 DIAGNOSIS — Z5111 Encounter for antineoplastic chemotherapy: Secondary | ICD-10-CM

## 2011-08-09 DIAGNOSIS — R112 Nausea with vomiting, unspecified: Secondary | ICD-10-CM

## 2011-08-09 DIAGNOSIS — C8190 Hodgkin lymphoma, unspecified, unspecified site: Secondary | ICD-10-CM

## 2011-08-09 DIAGNOSIS — C8191 Hodgkin lymphoma, unspecified, lymph nodes of head, face, and neck: Secondary | ICD-10-CM

## 2011-08-09 LAB — CBC WITH DIFFERENTIAL/PLATELET
Basophils Absolute: 0.1 10*3/uL (ref 0.0–0.1)
EOS%: 5.7 % (ref 0.0–7.0)
HGB: 14 g/dL (ref 13.0–17.1)
LYMPH%: 9 % — ABNORMAL LOW (ref 14.0–49.0)
MCH: 29.3 pg (ref 27.2–33.4)
MCV: 86 fL (ref 79.3–98.0)
MONO%: 12.9 % (ref 0.0–14.0)
Platelets: 263 10*3/uL (ref 140–400)
RBC: 4.78 10*6/uL (ref 4.20–5.82)
RDW: 14.7 % — ABNORMAL HIGH (ref 11.0–14.6)

## 2011-08-14 ENCOUNTER — Ambulatory Visit
Admission: RE | Admit: 2011-08-14 | Discharge: 2011-08-14 | Disposition: A | Discharge status: Home / Self Care | Payer: Federal, State, Local not specified - PPO | Source: Ambulatory Visit | Attending: Radiation Oncology | Admitting: Radiation Oncology

## 2011-08-14 DIAGNOSIS — C8171 Other classical Hodgkin lymphoma, lymph nodes of head, face, and neck: Secondary | ICD-10-CM | POA: Insufficient documentation

## 2011-08-28 ENCOUNTER — Telehealth: Payer: Self-pay

## 2011-08-28 NOTE — Telephone Encounter (Signed)
Received office notes from Dr. Candee Furbish, MD @ [facility]; forwarded to Dr. Gaylyn Rong.

## 2011-09-03 ENCOUNTER — Telehealth: Payer: Self-pay | Admitting: *Deleted

## 2011-09-03 NOTE — Telephone Encounter (Signed)
Pt called to f/u on request he made on 08/23/11 for Dr. Roselind Messier to present his case to the Tumor Board.  I had notified Samantha in [facility] of pt's request on 11/02 and she documented that she sent VM to Dr. Roselind Messier at that time.  I instructed pt to call Rad[facility]c dept directly to s/w Dr. Trina Ao nurse.  He verbalized understanding.  Pt also informs at this time that he is "leaning towards not getting Radiation" but still seeking another opinion from Tumor Board if possible.

## 2011-09-03 NOTE — Telephone Encounter (Signed)
Opened in error telephone encounter

## 2011-09-17 ENCOUNTER — Telehealth: Payer: Self-pay

## 2011-09-17 ENCOUNTER — Other Ambulatory Visit: Payer: Self-pay

## 2011-09-17 NOTE — Telephone Encounter (Signed)
Lm for Andrew Mosley and told him that Dr. Gaylyn Rong feels that the bumps on the inner thigh need to be evaluated first to determine what needs to be done. [facility].

## 2011-09-17 NOTE — Telephone Encounter (Signed)
MR. Manter CALLED STATING THAT THE BUMPS IN HIS INNER THIGH HAVE RE-OCCURED.  DR. HA TOLD HIM THAT THIS IS FOLLICULITIS.  HE DOES NOT HAVE HIS PRESCRIPTION FOR DOXYCYCLINE AT SCHOOL IN [facility].  AN AREA SEEMS TO BE INFECTED.  CAN DR. HA CALL IN A PRESCRIPTION IN [place] OR DOES HE WANT HIM TO GO TO THE [facility]?

## 2011-09-20 ENCOUNTER — Other Ambulatory Visit: Payer: Self-pay | Admitting: *Deleted

## 2011-09-20 ENCOUNTER — Ambulatory Visit (HOSPITAL_BASED_OUTPATIENT_CLINIC_OR_DEPARTMENT_OTHER): Payer: Federal, State, Local not specified - PPO

## 2011-09-20 DIAGNOSIS — C8191 Hodgkin lymphoma, unspecified, lymph nodes of head, face, and neck: Secondary | ICD-10-CM

## 2011-09-20 DIAGNOSIS — Z452 Encounter for adjustment and management of vascular access device: Secondary | ICD-10-CM

## 2011-09-20 MED ORDER — HEPARIN SOD (PORK) LOCK FLUSH 100 UNIT/ML IV SOLN
500.0000 [IU] | Freq: Once | INTRAVENOUS | Status: AC
  Administered 2011-09-20: 500 [IU] via INTRAVENOUS
  Filled 2011-09-20: qty 5

## 2011-09-20 MED ORDER — SODIUM CHLORIDE 0.9 % IJ SOLN
10.0000 mL | INTRAMUSCULAR | Status: DC | PRN
  Administered 2011-09-20: 10 mL via INTRAVENOUS
  Filled 2011-09-20: qty 10

## 2011-09-24 ENCOUNTER — Telehealth: Payer: Self-pay | Admitting: *Deleted

## 2011-09-24 NOTE — Telephone Encounter (Signed)
Pt called state he got a rx for Cephalexin from [facility] for Folliculitis on his leg.  He has taken 6 days of 10 day course and developed a rash on his forehead and strange sandpaper feeling inside his mouth, so he stopped taking it today.   Pt is home from college right now and unable to contact [facility], so asking for Dr. Lodema Pilot recommendation.   Instructed pt to call his local PCP or go to Urgent Care.  He verbalized understanding and also asks if ok to get a flu shot at this time.  Instructed pt ok to get flu shot now.

## 2011-10-30 ENCOUNTER — Telehealth: Payer: Self-pay | Admitting: Oncology

## 2011-10-30 ENCOUNTER — Other Ambulatory Visit: Payer: Self-pay | Admitting: Oncology

## 2011-10-30 DIAGNOSIS — C8590 Non-Hodgkin lymphoma, unspecified, unspecified site: Secondary | ICD-10-CM

## 2011-10-30 NOTE — Telephone Encounter (Signed)
Pt is aware of his 3 mnth f/u appt in jan

## 2011-11-08 ENCOUNTER — Encounter: Payer: Self-pay | Admitting: *Deleted

## 2011-11-15 ENCOUNTER — Other Ambulatory Visit (HOSPITAL_BASED_OUTPATIENT_CLINIC_OR_DEPARTMENT_OTHER): Payer: Federal, State, Local not specified - PPO | Admitting: Lab

## 2011-11-15 ENCOUNTER — Ambulatory Visit (HOSPITAL_BASED_OUTPATIENT_CLINIC_OR_DEPARTMENT_OTHER): Payer: Federal, State, Local not specified - PPO | Admitting: Oncology

## 2011-11-15 ENCOUNTER — Telehealth: Payer: Self-pay | Admitting: Oncology

## 2011-11-15 VITALS — BP 127/67 | HR 60 | Temp 99.1°F | Ht 67.0 in | Wt 173.9 lb

## 2011-11-15 DIAGNOSIS — C8190 Hodgkin lymphoma, unspecified, unspecified site: Secondary | ICD-10-CM

## 2011-11-15 DIAGNOSIS — Z452 Encounter for adjustment and management of vascular access device: Secondary | ICD-10-CM

## 2011-11-15 LAB — CBC WITH DIFFERENTIAL/PLATELET
Eosinophils Absolute: 0.3 10*3/uL (ref 0.0–0.5)
LYMPH%: 22.4 % (ref 14.0–49.0)
MONO#: 0.5 10*3/uL (ref 0.1–0.9)
NEUT#: 2.7 10*3/uL (ref 1.5–6.5)
Platelets: 199 10*3/uL (ref 140–400)
RBC: 4.94 10*6/uL (ref 4.20–5.82)
WBC: 4.6 10*3/uL (ref 4.0–10.3)
lymph#: 1 10*3/uL (ref 0.9–3.3)

## 2011-11-15 LAB — COMPREHENSIVE METABOLIC PANEL
Albumin: 4.6 g/dL (ref 3.5–5.2)
CO2: 29 mEq/L (ref 19–32)
Calcium: 9.2 mg/dL (ref 8.4–10.5)
Chloride: 103 mEq/L (ref 96–112)
Glucose, Bld: 92 mg/dL (ref 70–99)
Potassium: 4.3 mEq/L (ref 3.5–5.3)
Sodium: 139 mEq/L (ref 135–145)
Total Bilirubin: 1 mg/dL (ref 0.3–1.2)
Total Protein: 6.2 g/dL (ref 6.0–8.3)

## 2011-11-15 LAB — LACTATE DEHYDROGENASE: LDH: 132 U/L (ref 94–250)

## 2011-11-15 MED ORDER — SODIUM CHLORIDE 0.9 % IJ SOLN
10.0000 mL | INTRAMUSCULAR | Status: DC | PRN
  Administered 2011-11-15: 10 mL via INTRAVENOUS
  Filled 2011-11-15: qty 10

## 2011-11-15 MED ORDER — HEPARIN SOD (PORK) LOCK FLUSH 100 UNIT/ML IV SOLN
500.0000 [IU] | Freq: Once | INTRAVENOUS | Status: DC
  Filled 2011-11-15: qty 5

## 2011-11-15 NOTE — Telephone Encounter (Signed)
apts made for flush q 6 weeks and ct and md visit for April,printed for pt  aom

## 2011-11-15 NOTE — Progress Notes (Signed)
[facility] OFFICE PROGRESS NOTE  Cc:  Leo Grosser, MD, MD  DIAGNOSIS: history of stage III Hodgkin's lymphoma  PAST THERAPY:  s/p ABVD days 1 and 15 q 4 wks between 02/2011 and 07/2011 x6 cycles.  He was evaluated by 2 different radiation oncologists; however, he deferred consolidative radiation.   CURRENT THERAPY:  Watchful observation.   INTERVAL HISTORY: Andrew Mosley 21 y.o. male returns for regular follow up by himself.  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  He runs a few miles a day, and is training for marathon for April 2013.  He denies SOB, cough, CP with work out.  He denies fever, night sweat, anorexia, weight loss, node swelling, neuropathy, PND, orthopnea, pedal edema, skin rash, bone pain.  His bilateral thigh skin rash from 2 months ago has completely resolved.   MEDICAL HISTORY: Past Medical History  Diagnosis Date  . Hodgkin's lymphoma     hodgkins lymphoma dx 01/2011    SURGICAL HISTORY: No past surgical history on file.  MEDICATIONS: Current Outpatient Prescriptions  Medication Sig Dispense Refill  . loratadine (CLARITIN) 10 MG tablet Take 10 mg by mouth daily.       Current Facility-Administered Medications  Medication Dose Route Frequency Provider Last Rate Last Dose  . heparin lock flush 100 unit/mL  500 Units Intravenous Once Jethro Bolus, MD      . sodium chloride 0.9 % injection 10 mL  10 mL Intravenous PRN Jethro Bolus, MD   10 mL at 11/15/11 0900    ALLERGIES:   has no known allergies.  REVIEW OF SYSTEMS:  The rest of the 14-point review of system was negative.   Filed Vitals:   11/15/11 0838  BP: 127/67  Pulse: 60  Temp: 99.1 F (37.3 C)   Wt Readings from Last 3 Encounters:  11/15/11 173 lb 14.4 oz (78.881 kg)  08/14/11 168 lb 11.2 oz (76.522 kg)   ECOG Performance status: 0  PHYSICAL EXAMINATION:  General:  well-nourished in no acute distress.  Eyes:  no scleral icterus.  ENT:   There were no oropharyngeal lesions.  Neck was without thyromegaly.  Lymphatics:  Negative cervical, supraclavicular,axillary, or inguinal adenopathy.  Respiratory: lungs were clear bilaterally without wheezing or crackles.  Cardiovascular:  Regular rate and rhythm, S1/S2, without murmur, rub or gallop.  There was no pedal edema.  GI:  abdomen was soft, flat, nontender, nondistended, without organomegaly.  Muscoloskeletal:  no spinal tenderness of palpation of vertebral spine.  Skin exam was without echymosis, petichae.  Neuro exam was nonfocal.  Patient was able to get on and off exam table without assistance.  Gait was normal.  Patient was alerted and oriented.  Attention was good.   Language was appropriate.  Mood was normal without depression.  Speech was not pressured.  Thought content was not tangential.     LABORATORY/RADIOLOGY DATA:  Lab Results  Component Value Date   WBC 4.6 11/15/2011   HGB 14.8 11/15/2011   HCT 42.4 11/15/2011   PLT 199 11/15/2011   GLUCOSE 96 07/26/2011   ALT 13 07/26/2011   AST 18 07/26/2011   NA 140 07/26/2011   K 4.3 07/26/2011   CL 105 07/26/2011   CREATININE 0.83 07/26/2011   BUN 11 07/26/2011   CO2 23 07/26/2011    ASSESSMENT AND PLAN:   1. History of Hodgkin lymphoma:  No evidence of recurrent diease or side effects  of treatment on clinical history, physical exam, lab.  I recommended restarting CT chest/abdomen in 3 months which I ordered today.  I advised him not to start smoking since he is at higher risk of malignancies.  He is not smoking now.  I encouraged his aggressive regimen of work out to maintain his strength and health.  2. Follow up.  I will see the patient again in 3 months the day after CT scan.  He was arranged for port flush every 6-8 weeks until about 2 years out from therapy.  We will discuss then whether to remove the cath or not.

## 2011-11-26 ENCOUNTER — Telehealth: Payer: Self-pay | Admitting: Oncology

## 2011-11-26 NOTE — Telephone Encounter (Signed)
pt called lmovm to cancel appt on 02/06

## 2011-12-26 ENCOUNTER — Telehealth: Payer: Self-pay | Admitting: Oncology

## 2011-12-26 NOTE — Telephone Encounter (Signed)
pt called and r/s appt for 03/08 to 03/15

## 2012-01-03 ENCOUNTER — Ambulatory Visit (HOSPITAL_BASED_OUTPATIENT_CLINIC_OR_DEPARTMENT_OTHER): Payer: Federal, State, Local not specified - PPO

## 2012-01-03 VITALS — BP 114/60 | HR 66 | Temp 98.2°F

## 2012-01-03 DIAGNOSIS — C8190 Hodgkin lymphoma, unspecified, unspecified site: Secondary | ICD-10-CM

## 2012-01-03 DIAGNOSIS — Z469 Encounter for fitting and adjustment of unspecified device: Secondary | ICD-10-CM

## 2012-01-03 MED ORDER — SODIUM CHLORIDE 0.9 % IJ SOLN
10.0000 mL | INTRAMUSCULAR | Status: DC | PRN
  Administered 2012-01-03: 10 mL via INTRAVENOUS
  Filled 2012-01-03: qty 10

## 2012-01-03 MED ORDER — HEPARIN SOD (PORK) LOCK FLUSH 100 UNIT/ML IV SOLN
500.0000 [IU] | Freq: Once | INTRAVENOUS | Status: AC
  Administered 2012-01-03: 500 [IU] via INTRAVENOUS
  Filled 2012-01-03: qty 5

## 2012-01-31 ENCOUNTER — Ambulatory Visit (HOSPITAL_COMMUNITY)
Admission: RE | Admit: 2012-01-31 | Discharge: 2012-01-31 | Disposition: A | Discharge status: Home / Self Care | Payer: Federal, State, Local not specified - PPO | Source: Ambulatory Visit | Attending: Oncology | Admitting: Oncology

## 2012-01-31 ENCOUNTER — Encounter (HOSPITAL_COMMUNITY): Payer: Self-pay

## 2012-01-31 DIAGNOSIS — C8190 Hodgkin lymphoma, unspecified, unspecified site: Secondary | ICD-10-CM | POA: Insufficient documentation

## 2012-01-31 DIAGNOSIS — R599 Enlarged lymph nodes, unspecified: Secondary | ICD-10-CM | POA: Insufficient documentation

## 2012-01-31 MED ORDER — IOHEXOL 300 MG/ML  SOLN
100.0000 mL | Freq: Once | INTRAMUSCULAR | Status: AC | PRN
  Administered 2012-01-31: 100 mL via INTRAVENOUS

## 2012-02-07 ENCOUNTER — Telehealth: Payer: Self-pay | Admitting: Oncology

## 2012-02-07 ENCOUNTER — Encounter: Payer: Self-pay | Admitting: Oncology

## 2012-02-07 ENCOUNTER — Other Ambulatory Visit (HOSPITAL_BASED_OUTPATIENT_CLINIC_OR_DEPARTMENT_OTHER): Payer: Federal, State, Local not specified - PPO

## 2012-02-07 ENCOUNTER — Ambulatory Visit (HOSPITAL_BASED_OUTPATIENT_CLINIC_OR_DEPARTMENT_OTHER): Payer: Federal, State, Local not specified - PPO | Admitting: Oncology

## 2012-02-07 VITALS — BP 128/84 | HR 65 | Temp 98.2°F | Ht 67.0 in | Wt 174.4 lb

## 2012-02-07 DIAGNOSIS — R222 Localized swelling, mass and lump, trunk: Secondary | ICD-10-CM

## 2012-02-07 DIAGNOSIS — C8190 Hodgkin lymphoma, unspecified, unspecified site: Secondary | ICD-10-CM

## 2012-02-07 HISTORY — DX: Hodgkin lymphoma, unspecified, unspecified site: C81.90

## 2012-02-07 LAB — COMPREHENSIVE METABOLIC PANEL
BUN: 13 mg/dL (ref 6–23)
CO2: 28 mEq/L (ref 19–32)
Calcium: 9.7 mg/dL (ref 8.4–10.5)
Chloride: 103 mEq/L (ref 96–112)
Creatinine, Ser: 0.99 mg/dL (ref 0.50–1.35)
Total Bilirubin: 0.7 mg/dL (ref 0.3–1.2)

## 2012-02-07 LAB — CBC WITH DIFFERENTIAL/PLATELET
BASO%: 0.7 % (ref 0.0–2.0)
Basophils Absolute: 0 10*3/uL (ref 0.0–0.1)
EOS%: 11.3 % — ABNORMAL HIGH (ref 0.0–7.0)
HCT: 44.3 % (ref 38.4–49.9)
HGB: 15.4 g/dL (ref 13.0–17.1)
LYMPH%: 22.8 % (ref 14.0–49.0)
MCH: 30.9 pg (ref 27.2–33.4)
MCHC: 34.8 g/dL (ref 32.0–36.0)
MCV: 88.7 fL (ref 79.3–98.0)
MONO%: 7.3 % (ref 0.0–14.0)
NEUT%: 57.9 % (ref 39.0–75.0)

## 2012-02-07 LAB — LACTATE DEHYDROGENASE: LDH: 105 U/L (ref 94–250)

## 2012-02-07 NOTE — Telephone Encounter (Signed)
Gave pt appt for IR for portacath removal on 02/21/12, pt is aware of appt. Tina  From IR will remind pt day before procedure. Pt has appt to see [facility] in July with lab draw

## 2012-02-07 NOTE — Progress Notes (Signed)
[facility]  Telephone:([phone] Fax:([phone]) [phone]   OFFICE PROGRESS NOTE   Cc:  Leo Grosser, MD, MD  DIAGNOSIS: history of stage III Hodgkin's lymphoma   PAST THERAPY: s/p ABVD days 1 and 15 q 4 wks between 02/2011 and 07/2011 x6 cycles. He was evaluated by 2 different radiation oncologists; however, he deferred consolidative radiation.   CURRENT THERAPY: Watchful observation.   INTERVAL HISTORY: Andrew Mosley 21 y.o. male returns for regular follow up with his mother.  He reports doing well.  He recently ran a marathon and fished 50th out of 600 participants.  With exertion, he denied SOB, chest pain, wheezing, DOE.  He denies any residual side effects from chemo. He denies head ache, concentration problem at school, performance in school, palpable node swelling, bleeding symptoms, edema, abdominal pain, skin rash, bone pain.   Past Medical History  Diagnosis Date  . Hodgkin's lymphoma     hodgkins lymphoma dx 01/2011  . Hodgkin disease 02/07/2012    No past surgical history on file.  Current Outpatient Prescriptions  Medication Sig Dispense Refill  . loratadine (CLARITIN) 10 MG tablet Take 10 mg by mouth daily.        ALLERGIES:   has no known allergies.  REVIEW OF SYSTEMS:  The rest of the 14-point review of system was negative.   Filed Vitals:   02/07/12 0843  BP: 128/84  Pulse: 65  Temp: 98.2 F (36.8 C)   Wt Readings from Last 3 Encounters:  02/07/12 174 lb 6.4 oz (79.107 kg)  11/15/11 173 lb 14.4 oz (78.881 kg)  08/14/11 168 lb 11.2 oz (76.522 kg)   ECOG Performance status: 0  PHYSICAL EXAMINATION:   General:  well-nourished man in no acute distress.  Eyes:  no scleral icterus.  ENT:  There were no oropharyngeal lesions.  Neck was without thyromegaly.  Lymphatics:  Negative cervical, supraclavicular, axillary, or inguinal adenopathy.  Respiratory: lungs were clear bilaterally without wheezing or crackles.  Cardiovascular:  Regular  rate and rhythm, S1/S2, without murmur, rub or gallop.  There was no pedal edema.  GI:  abdomen was soft, flat, nontender, nondistended, without organomegaly.  Muscoloskeletal:  no spinal tenderness of palpation of vertebral spine.  Skin exam was without echymosis, petichae.  Neuro exam was nonfocal.  Patient was able to get on and off exam table without assistance.  Gait was normal.  Patient was alerted and oriented.  Attention was good.   Language was appropriate.  Mood was normal without depression.  Speech was not pressured.  Thought content was not tangential.     LABORATORY/RADIOLOGY DATA:  Lab Results  Component Value Date   WBC 5.6 02/07/2012   HGB 15.4 02/07/2012   HCT 44.3 02/07/2012   PLT 180 02/07/2012   GLUCOSE 95 02/07/2012   ALKPHOS 49 02/07/2012   ALT 19 02/07/2012   AST 24 02/07/2012   NA 140 02/07/2012   K 4.4 02/07/2012   CL 103 02/07/2012   CREATININE 0.99 02/07/2012   BUN 13 02/07/2012   CO2 28 02/07/2012    IMAGING:  I personally reviewed the following CT and showed the patient and his mother the images.  There was stable mediastinal mass.   Ct Chest W Contrast  01/31/2012  *RADIOLOGY REPORT*  Clinical Data:  History of Hodgkin's lymphoma.  Surveillance scan.  CT CHEST AND ABDOMEN WITH CONTRAST  Technique:  Multidetector CT imaging of the chest and abdomen was performed following the standard protocol  during bolus administration of intravenous contrast.  Contrast: OMNIPAQUE IOHEXOL 300 MG/ML  SOLN  Comparison:  PET CT 08/08/2011.  CT CHEST  Findings:  Mediastinum: Again noted is bulky anterior mediastinal adenopathy located anterior to the left innominate vein, anterior to the ascending thoracic aorta, and anterior to the right atrial appendage, right ventricular outflow tract and the anterior wall of the left ventricle. The overall bulk of the lymph nodes in these regions appears very similar to the prior examination 08/08/2011. No definite new adenopathy is identified.   Specifically, no middle mediastinal, posterior mediastinal or hilar adenopathy is appreciated. Heart size is normal. There is no significant pericardial fluid, thickening or pericardial calcification.  Right internal jugular single lumen Port-A-Cath with tip terminating in the right atrium.  Esophagus is unremarkable in appearance.  Lungs/Pleura: No suspicious appearing pulmonary nodules or masses. No consolidative airspace disease.  No pleural effusions.  Musculoskeletal: There are no aggressive appearing lytic or blastic lesions noted in the visualized portions of the skeleton.  IMPRESSION:  1.  No change in bulky anterior mediastinal adenopathy compared to prior PET CT 08/08/2011, at which time, these nodes demonstrated no metabolic activity.  There are no other findings within the thorax to suggest recurrence of disease. The appearance of the thorax is otherwise unremarkable.  CT ABDOMEN  Findings:  Abdomen: The enhanced appearance of the liver, gallbladder, pancreas, spleen, bilateral adrenal glands and bilateral kidneys is unremarkable.  No definite pathologically enlarged lymph nodes are identified within the abdomen or pelvis.  Normal appendix.  No ascites or pneumoperitoneum and no pathologic distension of bowel noted in the visualized portions of the abdomen.  Musculoskeletal: There are no aggressive appearing lytic or blastic lesions noted in the visualized portions of the skeleton.  IMPRESSION:  1.  No evidence to suggest disease recurrence within the abdomen.  Original Report Authenticated By: Florencia Reasons, M.D.   Ct Abdomen W Contrast  01/31/2012  *RADIOLOGY REPORT*  Clinical Data:  History of Hodgkin's lymphoma.  Surveillance scan.  CT CHEST AND ABDOMEN WITH CONTRAST  Technique:  Multidetector CT imaging of the chest and abdomen was performed following the standard protocol during bolus administration of intravenous contrast.  Contrast: OMNIPAQUE IOHEXOL 300 MG/ML  SOLN  Comparison:   PET CT 08/08/2011.  CT CHEST  Findings:  Mediastinum: Again noted is bulky anterior mediastinal adenopathy located anterior to the left innominate vein, anterior to the ascending thoracic aorta, and anterior to the right atrial appendage, right ventricular outflow tract and the anterior wall of the left ventricle. The overall bulk of the lymph nodes in these regions appears very similar to the prior examination 08/08/2011. No definite new adenopathy is identified.  Specifically, no middle mediastinal, posterior mediastinal or hilar adenopathy is appreciated. Heart size is normal. There is no significant pericardial fluid, thickening or pericardial calcification.  Right internal jugular single lumen Port-A-Cath with tip terminating in the right atrium.  Esophagus is unremarkable in appearance.  Lungs/Pleura: No suspicious appearing pulmonary nodules or masses. No consolidative airspace disease.  No pleural effusions.  Musculoskeletal: There are no aggressive appearing lytic or blastic lesions noted in the visualized portions of the skeleton.  IMPRESSION:  1.  No change in bulky anterior mediastinal adenopathy compared to prior PET CT 08/08/2011, at which time, these nodes demonstrated no metabolic activity.  There are no other findings within the thorax to suggest recurrence of disease. The appearance of the thorax is otherwise unremarkable.  CT ABDOMEN  Findings:  Abdomen: The enhanced appearance of the liver, gallbladder, pancreas, spleen, bilateral adrenal glands and bilateral kidneys is unremarkable.  No definite pathologically enlarged lymph nodes are identified within the abdomen or pelvis.  Normal appendix.  No ascites or pneumoperitoneum and no pathologic distension of bowel noted in the visualized portions of the abdomen.  Musculoskeletal: There are no aggressive appearing lytic or blastic lesions noted in the visualized portions of the skeleton.  IMPRESSION:  1.  No evidence to suggest disease recurrence  within the abdomen.  Original Report Authenticated By: Florencia Reasons, M.D.     ASSESSMENT AND PLAN:   1. History of Hodgkin lymphoma: No evidence of recurrent diease or side effects of treatment on clinical history, physical exam, lab, or CT.  He still has stable mediastinal mass which was the same size since last PET scan that showed no uptake in the mediastinal mass.  His next CT will be in about 6 months.  I advised him to watch out for fatigue, SOB, wheezing, palpable nodes or any concerning symptoms.  He has f/u with [place] for clinical exam in about 3 months.  He wants to remove the portacath since he will have lots of physical training this summer.  I referred him to IR for removal of portacath.    The length of time of the face-to-face encounter was . More than 50% of time was spent counseling and coordination of care.

## 2012-02-07 NOTE — Progress Notes (Signed)
Patient reports that he will have port-a-cath removed in May, 2013.

## 2012-02-07 NOTE — Patient Instructions (Signed)
A.  Result of CT:   CT CHEST  Findings:  Mediastinum: Again noted is bulky anterior mediastinal adenopathy  located anterior to the left innominate vein, anterior to the  ascending thoracic aorta, and anterior to the right atrial  appendage, right ventricular outflow tract and the anterior wall of  the left ventricle. The overall bulk of the lymph nodes in these  regions appears very similar to the prior examination 08/08/2011.  No definite new adenopathy is identified. Specifically, no middle  mediastinal, posterior mediastinal or hilar adenopathy is  appreciated. Heart size is normal. There is no significant  pericardial fluid, thickening or pericardial calcification. Right  internal jugular single lumen Port-A-Cath with tip terminating in  the right atrium. Esophagus is unremarkable in appearance.  Lungs/Pleura: No suspicious appearing pulmonary nodules or masses.  No consolidative airspace disease. No pleural effusions.  Musculoskeletal: There are no aggressive appearing lytic or blastic  lesions noted in the visualized portions of the skeleton.  IMPRESSION:  1. No change in bulky anterior mediastinal adenopathy compared to  prior PET CT 08/08/2011, at which time, these nodes demonstrated no  metabolic activity. There are no other findings within the thorax  to suggest recurrence of disease. The appearance of the thorax is  otherwise unremarkable.  CT ABDOMEN  Findings:  Abdomen: The enhanced appearance of the liver, gallbladder,  pancreas, spleen, bilateral adrenal glands and bilateral kidneys is  unremarkable. No definite pathologically enlarged lymph nodes are  identified within the abdomen or pelvis. Normal appendix. No  ascites or pneumoperitoneum and no pathologic distension of bowel  noted in the visualized portions of the abdomen.  Musculoskeletal: There are no aggressive appearing lytic or blastic  lesions noted in the visualized portions of the skeleton.  IMPRESSION:   1. No evidence to suggest disease recurrence within the abdomen.  B.  Follow up:  - lab/RV with Belenda Cruise (Dr. Lodema Pilot nurse practitioner) in about 3 months.

## 2012-02-18 ENCOUNTER — Encounter (HOSPITAL_COMMUNITY): Payer: Self-pay | Admitting: Pharmacy Technician

## 2012-02-19 ENCOUNTER — Other Ambulatory Visit: Payer: Self-pay | Admitting: Radiology

## 2012-02-20 ENCOUNTER — Telehealth (HOSPITAL_COMMUNITY): Payer: Self-pay | Admitting: Oncology

## 2012-02-21 ENCOUNTER — Ambulatory Visit (HOSPITAL_COMMUNITY)
Admission: RE | Admit: 2012-02-21 | Discharge: 2012-02-21 | Disposition: A | Discharge status: Home / Self Care | Payer: Federal, State, Local not specified - PPO | Source: Ambulatory Visit | Attending: Oncology | Admitting: Oncology

## 2012-02-21 ENCOUNTER — Other Ambulatory Visit: Payer: Self-pay | Admitting: Oncology

## 2012-02-21 DIAGNOSIS — C8190 Hodgkin lymphoma, unspecified, unspecified site: Secondary | ICD-10-CM

## 2012-02-21 LAB — CBC
HCT: 43 % (ref 39.0–52.0)
MCH: 30.6 pg (ref 26.0–34.0)
MCHC: 35.6 g/dL (ref 30.0–36.0)
MCV: 86 fL (ref 78.0–100.0)
Platelets: 186 10*3/uL (ref 150–400)
RDW: 12 % (ref 11.5–15.5)
WBC: 5.5 10*3/uL (ref 4.0–10.5)

## 2012-02-21 MED ORDER — SODIUM CHLORIDE 0.9 % IV SOLN: Freq: Once | INTRAVENOUS | Status: DC

## 2012-02-21 MED ORDER — CEFAZOLIN SODIUM 1-5 GM-% IV SOLN
1.0000 g | Freq: Once | INTRAVENOUS | Status: AC
  Administered 2012-02-21: 1 g via INTRAVENOUS

## 2012-02-21 MED ORDER — SODIUM BICARBONATE 4 % IV SOLN
INTRAVENOUS | Status: AC
  Filled 2012-02-21: qty 5

## 2012-02-21 MED ORDER — CEFAZOLIN SODIUM 1-5 GM-% IV SOLN
INTRAVENOUS | Status: AC
  Administered 2012-02-21: 1 g via INTRAVENOUS
  Filled 2012-02-21: qty 50

## 2012-02-21 MED ORDER — LIDOCAINE HCL 1 % IJ SOLN
INTRAMUSCULAR | Status: AC
  Filled 2012-02-21: qty 20

## 2012-02-21 NOTE — H&P (Signed)
Andrew Mosley is an 21 y.o. male.   Chief Complaint: Portacath removal HPI: History of Hodgkins lymphoma.  Completed treatment and no signs of active disease.  Patient would like port removed.   Past Medical History  Diagnosis Date  . Hodgkin's lymphoma     hodgkins lymphoma dx 01/2011  . Hodgkin disease 02/07/2012    No past surgical history on file.  No family history on file. Social History:  does not have a smoking history on file. He does not have any smokeless tobacco history on file. His alcohol and drug histories not on file.  Allergies: No Known Allergies   (Not in a hospital admission)  Results for orders placed during the hospital encounter of 02/21/12 (from the past 48 hour(s))  APTT     Status: Normal   Collection Time   02/21/12  7:28 AM      Component Value Range Comment   aPTT 31  24 - 37 (seconds)   CBC     Status: Normal   Collection Time   02/21/12  7:28 AM      Component Value Range Comment   WBC 5.5  4.0 - 10.5 (K/uL)    RBC 5.00  4.22 - 5.81 (MIL/uL)    Hemoglobin 15.3  13.0 - 17.0 (g/dL)    HCT 16.1  09.6 - 04.5 (%)    MCV 86.0  78.0 - 100.0 (fL)    MCH 30.6  26.0 - 34.0 (pg)    MCHC 35.6  30.0 - 36.0 (g/dL)    RDW 40.9  81.1 - 91.4 (%)    Platelets 186  150 - 400 (K/uL)   PROTIME-INR     Status: Normal   Collection Time   02/21/12  7:28 AM      Component Value Range Comment   Prothrombin Time 14.0  11.6 - 15.2 (seconds)    INR 1.06  0.00 - 1.49     No results found.  Review of Systems  Constitutional: Negative.     Blood pressure 131/73, pulse 64, temperature 98.3 F (36.8 C), temperature source Oral, resp. rate 14, height 5\' 7"  (1.702 m), weight 174 lb (78.926 kg), SpO2 100.00%. Physical Exam  Constitutional: He appears well-developed and well-nourished.  Cardiovascular: Normal rate, regular rhythm and normal heart sounds.   Respiratory: Effort normal and breath sounds normal.     Assessment/Plan History of Hodgkins lymphoma and no longer  needs port.  Informed consent obtained. Plan to remove port today.  Cadon Raczka RYAN 02/21/2012, 8:18 AM

## 2012-02-21 NOTE — Procedures (Signed)
Successful removal of right chest portacath.  No immediate complication.   

## 2012-05-08 ENCOUNTER — Encounter: Payer: Self-pay | Admitting: Oncology

## 2012-05-08 ENCOUNTER — Other Ambulatory Visit (HOSPITAL_BASED_OUTPATIENT_CLINIC_OR_DEPARTMENT_OTHER): Payer: Federal, State, Local not specified - PPO | Admitting: Lab

## 2012-05-08 ENCOUNTER — Ambulatory Visit (HOSPITAL_BASED_OUTPATIENT_CLINIC_OR_DEPARTMENT_OTHER): Payer: Federal, State, Local not specified - PPO | Admitting: Oncology

## 2012-05-08 VITALS — BP 127/78 | HR 66 | Temp 99.1°F | Ht 67.0 in | Wt 179.4 lb

## 2012-05-08 DIAGNOSIS — C8190 Hodgkin lymphoma, unspecified, unspecified site: Secondary | ICD-10-CM

## 2012-05-08 LAB — CBC WITH DIFFERENTIAL/PLATELET
BASO%: 0.8 % (ref 0.0–2.0)
Eosinophils Absolute: 0.5 10*3/uL (ref 0.0–0.5)
HCT: 42.7 % (ref 38.4–49.9)
LYMPH%: 26 % (ref 14.0–49.0)
MCHC: 34.9 g/dL (ref 32.0–36.0)
MCV: 88.7 fL (ref 79.3–98.0)
MONO#: 0.5 10*3/uL (ref 0.1–0.9)
MONO%: 8.6 % (ref 0.0–14.0)
NEUT%: 56.8 % (ref 39.0–75.0)
Platelets: 186 10*3/uL (ref 140–400)
RBC: 4.81 10*6/uL (ref 4.20–5.82)
WBC: 6.1 10*3/uL (ref 4.0–10.3)

## 2012-05-08 LAB — COMPREHENSIVE METABOLIC PANEL
Alkaline Phosphatase: 45 U/L (ref 39–117)
CO2: 26 mEq/L (ref 19–32)
Creatinine, Ser: 0.99 mg/dL (ref 0.50–1.35)
Glucose, Bld: 94 mg/dL (ref 70–99)
Total Bilirubin: 1.2 mg/dL (ref 0.3–1.2)

## 2012-05-08 LAB — LACTATE DEHYDROGENASE: LDH: 108 U/L (ref 94–250)

## 2012-05-08 NOTE — Progress Notes (Signed)
[facility]  Telephone:(336) [phone] Fax:(336) [phone]   OFFICE PROGRESS NOTE   Cc:  PICKARD,WARREN TOM, MD  DIAGNOSIS: history of stage III Hodgkin's lymphoma   PAST THERAPY: s/p ABVD days 1 and 15 q 4 wks between 02/2011 and 07/2011 x6 cycles. He was evaluated by 2 different radiation oncologists; however, he deferred consolidative radiation.   CURRENT THERAPY: Watchful observation.   INTERVAL HISTORY: Andrew Mosley 21 y.o. male returns for regular follow up with his father.  He reports doing well. Continues to attend [employer] full-time. With exertion, he denied SOB, chest pain, wheezing, DOE.  He denies any residual side effects from chemo. He denies head ache, concentration problem at school, performance in school, palpable node swelling, bleeding symptoms, edema, abdominal pain, skin rash, bone pain.   Past Medical History  Diagnosis Date  . Hodgkin's lymphoma     hodgkins lymphoma dx 01/2011  . Hodgkin disease 02/07/2012    History reviewed. No pertinent past surgical history.  Current Outpatient Prescriptions  Medication Sig Dispense Refill  . fish oil-omega-3 fatty acids 1000 MG capsule Take 1 g by mouth daily.      . Multiple Vitamin (MULTIVITAMIN) tablet Take 1 tablet by mouth daily.        ALLERGIES:   has no known allergies.  REVIEW OF SYSTEMS:  The rest of the 14-point review of system was negative.   Filed Vitals:   05/08/12 1102  BP: 127/78  Pulse: 66  Temp: 99.1 F (37.3 C)   Wt Readings from Last 3 Encounters:  05/08/12 179 lb 6.4 oz (81.375 kg)  02/21/12 174 lb (78.926 kg)  02/07/12 174 lb 6.4 oz (79.107 kg)   ECOG Performance status: 0  PHYSICAL EXAMINATION:   General:  well-nourished man in no acute distress.  Eyes:  no scleral icterus.  ENT:  There were no oropharyngeal lesions.  Neck was without thyromegaly.  Lymphatics:  Negative cervical, supraclavicular, axillary, or inguinal adenopathy.  Respiratory: lungs were clear  bilaterally without wheezing or crackles.  Cardiovascular:  Regular rate and rhythm, S1/S2, without murmur, rub or gallop.  There was no pedal edema.  GI:  abdomen was soft, flat, nontender, nondistended, without organomegaly.  Muscoloskeletal:  no spinal tenderness of palpation of vertebral spine.  Skin exam was without echymosis, petichae.  Neuro exam was nonfocal.  Patient was able to get on and off exam table without assistance.  Gait was normal.  Patient was alerted and oriented.  Attention was good.   Language was appropriate.  Mood was normal without depression.  Speech was not pressured.  Thought content was not tangential.     LABORATORY/RADIOLOGY DATA:  Lab Results  Component Value Date   WBC 6.1 05/08/2012   HGB 14.9 05/08/2012   HCT 42.7 05/08/2012   PLT 186 05/08/2012   GLUCOSE 95 02/07/2012   ALKPHOS 49 02/07/2012   ALT 19 02/07/2012   AST 24 02/07/2012   NA 140 02/07/2012   K 4.4 02/07/2012   CL 103 02/07/2012   CREATININE 0.99 02/07/2012   BUN 13 02/07/2012   CO2 28 02/07/2012   INR 1.06 02/21/2012    ASSESSMENT AND PLAN:   1. History of Hodgkin lymphoma: No evidence of recurrent diease or side effects of treatment on clinical history, physical exam, or lab.  He still has stable mediastinal mass which was the same size since last PET scan that showed no uptake in the mediastinal mass.  Next CT [place] due  in 3 months. I advised him to watch out for fatigue, SOB, wheezing, palpable nodes or any concerning symptoms. 2. Follow-up: In 3 months with labs and CT scan.   The length of time of the face-to-face encounter was 15 minutes. More than 50% of time was spent counseling and coordination of care.

## 2012-05-09 IMAGING — CT NM PET TUM IMG RESTAG (PS) SKULL BASE T - THIGH
1 of 6 series · 1 of 25 positions shown · IV contrast ([ID])
Comparison: PET CT 04/18/2011

CLINICAL DATA: treatment strategy for Hodgkin's lymphoma.  Last
chemotherapy July 2011.

NUCLEAR MEDICINE PET SKULL BASE TO THIGH
Fasting Blood Glucose:  106
TECHNIQUE: 18.5 mCi F-18 FDG was injected intravenously via the
right antecubital fossa.  Full-ring PET imaging was performed from
the skull base through the mid-thighs 58  minutes after injection.
CT data was obtained and used for attenuation correction and
anatomic localization only.  (This was not acquired as a diagnostic
CT examination.)

[Series 2: ct images · axial · 3.8mm · 0.98mm/px · 1 of 266 slices shown]
[im 266/266  brain]
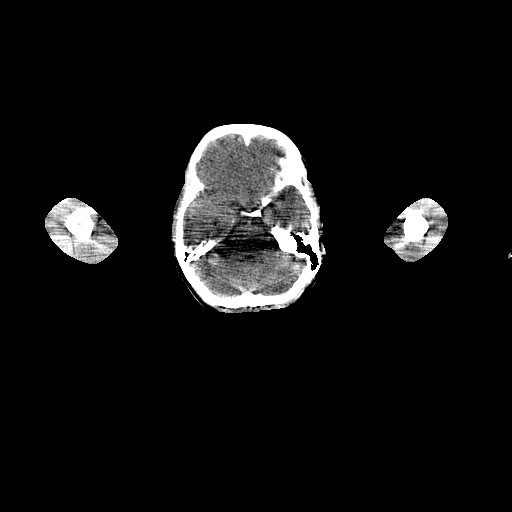

[1 of 25 positions shown; findings below may reference images not displayed]

FINDINGS: Neck:No hypermetabolic nodes in the neck.

Chest:Anterior mediastinal nodal conglomerate has no significant
metabolic activity.  The nodal masses has decreased in volume
compared to prior.  For example,  mass in the prevascular space
measures 2.4 x 8.0 cm compared to 3.1 x 9.2 cm on prior.  Anterior
mediastinal lymph node, anterior to the ascending aorta, measures
2.8 cm short axis compared to the 2.7 cm on prior.  Review of the
lung parenchyma demonstrates no suspicious pulmonary nodules.

Abdomen / Pelvis:The spleen is normal volume without hypermetabolic
activity.  No hypermetabolic abdominal or pelvic lymph nodes.

Skeleton:There is elevation of metabolic marrow activity similar to
prior.  The T10 vertebral body  is  lower activity compared to
adjacent lumbar and thoracic vertebral bodies.  This is felt to
relate to asymmetric marrow response and would be unlikely
neoplastic process.
IMPRESSION: 1.  No evidence of lymphoma recurrence.
2.  No hypermetabolic activity within the bulky anterior
mediastinal nodal masses which have decreased slightly in size
compared to prior.

## 2012-06-28 NOTE — Progress Notes (Signed)
Not applicable.  Before EPIC gone live.  

## 2012-08-07 ENCOUNTER — Ambulatory Visit (HOSPITAL_COMMUNITY)
Admission: RE | Admit: 2012-08-07 | Discharge: 2012-08-07 | Disposition: A | Discharge status: Home / Self Care | Payer: Federal, State, Local not specified - PPO | Source: Ambulatory Visit | Attending: Oncology | Admitting: Oncology

## 2012-08-07 ENCOUNTER — Other Ambulatory Visit (HOSPITAL_BASED_OUTPATIENT_CLINIC_OR_DEPARTMENT_OTHER): Payer: Federal, State, Local not specified - PPO

## 2012-08-07 DIAGNOSIS — R161 Splenomegaly, not elsewhere classified: Secondary | ICD-10-CM | POA: Insufficient documentation

## 2012-08-07 DIAGNOSIS — C8190 Hodgkin lymphoma, unspecified, unspecified site: Secondary | ICD-10-CM | POA: Insufficient documentation

## 2012-08-07 DIAGNOSIS — R599 Enlarged lymph nodes, unspecified: Secondary | ICD-10-CM | POA: Insufficient documentation

## 2012-08-07 LAB — COMPREHENSIVE METABOLIC PANEL (CC13)
AST: 22 U/L (ref 5–34)
Albumin: 4.5 g/dL (ref 3.5–5.0)
BUN: 15 mg/dL (ref 7.0–26.0)
CO2: 24 mEq/L (ref 22–29)
Calcium: 9.6 mg/dL (ref 8.4–10.4)
Chloride: 107 mEq/L (ref 98–107)
Glucose: 89 mg/dl (ref 70–99)
Potassium: 4.1 mEq/L (ref 3.5–5.1)

## 2012-08-07 LAB — CBC WITH DIFFERENTIAL/PLATELET
Basophils Absolute: 0 10*3/uL (ref 0.0–0.1)
Eosinophils Absolute: 0.3 10*3/uL (ref 0.0–0.5)
HCT: 44 % (ref 38.4–49.9)
HGB: 15.4 g/dL (ref 13.0–17.1)
MONO#: 0.4 10*3/uL (ref 0.1–0.9)
NEUT%: 59.8 % (ref 39.0–75.0)
Platelets: 187 10*3/uL (ref 140–400)
WBC: 5.5 10*3/uL (ref 4.0–10.3)
lymph#: 1.4 10*3/uL (ref 0.9–3.3)

## 2012-08-07 MED ORDER — IOHEXOL 300 MG/ML  SOLN
100.0000 mL | Freq: Once | INTRAMUSCULAR | Status: AC | PRN
  Administered 2012-08-07: 100 mL via INTRAVENOUS

## 2012-08-09 NOTE — Patient Instructions (Addendum)
A.  CT result:   CT CHEST  Findings:  Mediastinum: As with the prior examination from 01/31/2012, there  continues to be some bulky anterior mediastinal adenopathy.  However, the overall size of these lymph nodes conglomerates  appears similar to slightly smaller than the prior examinations  measuring up to 6.8 x 3.0 cm on the left (image 22 of series 2) as  compared with 7.4 x 3.0 cm when measured in a similar fashion on  the prior examination, and 2.6 x 4.8 cm on the right (image 23 of  series 2) as compared with to 2.8 x 5.1 cm on the prior examination  and measured in a similar fashion. No other definite new or  enlarging suspicious appearing lymph nodes are identified in the  hilar regions, supraclavicular regions or axillary distributions.  Heart size is normal. There is no significant pericardial fluid,  thickening or pericardial calcification. Esophagus is unremarkable  in appearance.  Lungs/Pleura: No suspicious appearing pulmonary nodules or masses  are identified. No consolidative airspace disease. No pleural  effusions.  Musculoskeletal: There are no aggressive appearing lytic or blastic  lesions noted in the visualized portions of the skeleton.   IMPRESSION:  1. Bulky anterior mediastinal adenopathy is similar to slightly  decreased in size compared to the prior examination from April  2013. Given the lack of hypermetabolic activity in these lymph  nodes on PET CT 08/08/2011 and lack of interval growth, findings  are not suggestive of recurrence of lymphoma.   CT ABDOMEN AND PELVIS  Findings:  Abdomen/Pelvis: The enhanced appearance of the liver, gallbladder,  pancreas, bilateral adrenal glands and bilateral kidneys is  unremarkable. The spleen is enlarged measuring 13.6 x 5.3 by 12.5  cm. No ascites or pneumoperitoneum and no pathologic distension of  small bowel. No definite pathologic lymphadenopathy identified  within the abdomen or pelvis. The appendix is  normal (retrocecal  in position). Urinary bladder is unremarkable in appearance.  Musculoskeletal: There are no aggressive appearing lytic or blastic  lesions noted in the visualized portions of the skeleton.   IMPRESSION:  1. No definitive findings to suggest disease recurrence within the  abdomen or pelvis.  2. However, there is mild splenomegaly. The spleen measures 13.6  x 5.3 x 12.5 cm (estimated splenic volume of 450 cubic  centimeters).  B.  Plan:  Repeat CT scan in about 6 months.

## 2012-08-10 ENCOUNTER — Ambulatory Visit (HOSPITAL_BASED_OUTPATIENT_CLINIC_OR_DEPARTMENT_OTHER): Payer: Federal, State, Local not specified - PPO | Admitting: Oncology

## 2012-08-10 ENCOUNTER — Telehealth: Payer: Self-pay | Admitting: Oncology

## 2012-08-10 VITALS — BP 129/79 | HR 63 | Temp 98.1°F | Resp 20 | Ht 67.0 in | Wt 188.9 lb

## 2012-08-10 DIAGNOSIS — R161 Splenomegaly, not elsewhere classified: Secondary | ICD-10-CM

## 2012-08-10 DIAGNOSIS — R222 Localized swelling, mass and lump, trunk: Secondary | ICD-10-CM

## 2012-08-10 DIAGNOSIS — C8190 Hodgkin lymphoma, unspecified, unspecified site: Secondary | ICD-10-CM

## 2012-08-10 NOTE — Progress Notes (Signed)
[name]  [phone] Fax:([phone]   OFFICE PROGRESS NOTE   Cc:  PICKARD,WARREN TOM, MD  DIAGNOSIS: history of stage III Hodgkin's lymphoma   PAST THERAPY: s/p ABVD days 1 and 15 q 4 wks between 02/2011 and 07/2011 x6 cycles. He was evaluated by 2 different radiation oncologists; however, he deferred consolidative radiation.   CURRENT THERAPY: Watchful observation.   INTERVAL HISTORY: Andrew Mosley 21 y.o. male returns for regular follow up with his mother.  He reports feeling relatively well.  He is no longer with [employer].  Therefore, he has been doing less exercise than before.  He has been gaining weight.    Patient denies fever, anorexia, weight loss, fatigue, headache, visual changes, confusion, drenching night sweats, palpable lymph node swelling, mucositis, odynophagia, dysphagia, nausea vomiting, jaundice, chest pain, palpitation, shortness of breath, dyspnea on exertion, productive cough, gum bleeding, epistaxis, hematemesis, hemoptysis, abdominal pain, abdominal swelling, early satiety, melena, hematochezia, hematuria, skin rash, spontaneous bleeding, joint swelling, joint pain, heat or cold intolerance, bowel bladder incontinence, back pain, focal motor weakness, paresthesia, depression, suicidal or homicidal ideation, feeling hopelessness.   Past Medical History  Diagnosis Date  . Hodgkin's lymphoma     hodgkins lymphoma dx 01/2011  . Hodgkin disease 02/07/2012    No past surgical history on file.  Current Outpatient Prescriptions  Medication Sig Dispense Refill  . loratadine (CLARITIN) 10 MG tablet Take 10 mg by mouth daily.      . Multiple Vitamin (MULTIVITAMIN) tablet Take 1 tablet by mouth daily.      . fish oil-omega-3 fatty acids 1000 MG capsule Take 1 g by mouth daily.        ALLERGIES:   has no known allergies.  REVIEW OF SYSTEMS:  The rest of the 14-point review of system was negative.   Filed Vitals:   08/10/12 0911    BP: 129/79  Pulse: 63  Temp: 98.1 F (36.7 C)  Resp: 20   Wt Readings from Last 3 Encounters:  08/10/12 188 lb 14.4 oz (85.684 kg)  05/08/12 179 lb 6.4 oz (81.375 kg)  02/21/12 174 lb (78.926 kg)   ECOG Performance status: 0  PHYSICAL EXAMINATION:  General:  well-nourished man, in no acute distress.  Eyes:  no scleral icterus.  ENT:  There were no oropharyngeal lesions.  Neck was without thyromegaly.  Lymphatics:  Negative cervical, supraclavicular or axillary adenopathy.  Respiratory: lungs were clear bilaterally without wheezing or crackles.  Cardiovascular:  Regular rate and rhythm, S1/S2, without murmur, rub or gallop.  There was no pedal edema.  GI:  abdomen was soft, flat, nontender, nondistended, without organomegaly.  Muscoloskeletal:  no spinal tenderness of palpation of vertebral spine.  Skin exam was without echymosis, petichae.  Neuro exam was nonfocal.  Patient was able to get on and off exam table without assistance.  Gait was normal.  Patient was alerted and oriented.  Attention was good.   Language was appropriate.  Mood was normal without depression.  Speech was not pressured.  Thought content was not tangential.     LABORATORY/RADIOLOGY DATA:  Lab Results  Component Value Date   WBC 5.5 08/07/2012   HGB 15.4 08/07/2012   HCT 44.0 08/07/2012   PLT 187 08/07/2012   GLUCOSE 89 08/07/2012   ALKPHOS 49 08/07/2012   ALT 17 08/07/2012   AST 22 08/07/2012   NA 140 08/07/2012   K 4.1 08/07/2012   CL 107 08/07/2012   CREATININE 0.9  08/07/2012   BUN 15.0 08/07/2012   CO2 24 08/07/2012   INR 1.06 02/21/2012   IMAGING:  I personally reviewed the following CT.    Ct Chest W Contrast  08/07/2012  *RADIOLOGY REPORT*  Clinical Data:  History of Hodgkin's lymphoma diagnosed in 2012.  CT CHEST, ABDOMEN AND PELVIS WITH CONTRAST  Technique:  Multidetector CT imaging of the chest, abdomen and pelvis was performed following the standard protocol during bolus administration of  intravenous contrast.  Contrast: OMNIPAQUE IOHEXOL 300 MG/ML  SOLN  Comparison:  CT of chest abdomen and pelvis 01/31/2012.  PET CT 08/08/2011.  CT CHEST  Findings:  Mediastinum: As with the prior examination from 01/31/2012, there continues to be some bulky anterior mediastinal adenopathy. However, the overall size of these lymph nodes conglomerates appears similar to slightly smaller than the prior examinations measuring up to 6.8 x 3.0 cm on the left (image 22 of series 2) as compared with 7.4 x 3.0 cm when measured in a similar fashion on the prior examination, and 2.6 x 4.8 cm on the right (image 23 of series 2) as compared with to 2.8 x 5.1 cm on the prior examination and measured in a similar fashion.  No other definite new or enlarging suspicious appearing lymph nodes are identified in the hilar regions, supraclavicular regions or axillary distributions. Heart size is normal. There is no significant pericardial fluid, thickening or pericardial calcification.  Esophagus is unremarkable in appearance.  Lungs/Pleura: No suspicious appearing pulmonary nodules or masses are identified.  No consolidative airspace disease.  No pleural effusions.  Musculoskeletal: There are no aggressive appearing lytic or blastic lesions noted in the visualized portions of the skeleton.  IMPRESSION:  1.  Bulky anterior mediastinal adenopathy is similar to slightly decreased in size compared to the prior examination from April 2013.  Given the lack of hypermetabolic activity in these lymph nodes on PET CT 08/08/2011 and lack of interval growth, findings are not suggestive of recurrence of lymphoma.  CT ABDOMEN AND PELVIS  Findings:  Abdomen/Pelvis: The enhanced appearance of the liver, gallbladder, pancreas, bilateral adrenal glands and bilateral kidneys is unremarkable.  The spleen is enlarged measuring 13.6 x 5.3 by 12.5 cm.  No ascites or pneumoperitoneum and no pathologic distension of small bowel.  No definite pathologic  lymphadenopathy identified within the abdomen or pelvis.  The appendix is normal (retrocecal in position).  Urinary bladder is unremarkable in appearance.  Musculoskeletal: There are no aggressive appearing lytic or blastic lesions noted in the visualized portions of the skeleton.  IMPRESSION:  1.  No definitive findings to suggest disease recurrence within the abdomen or pelvis. 2.  However, there is mild splenomegaly.  The spleen measures 13.6 x 5.3 x 12.5 cm (estimated splenic volume of 450 cubic centimeters).   Original Report Authenticated By: Florencia Reasons, M.D.     ASSESSMENT AND PLAN:   1.  History of Hodgkin lymphoma:  CT scan showed stable mediastinal mass (which was not hot on post chemo PET scan).  He has slight splenomegaly which is not enough to say recurrent lymphoma.  I recommended watchful observation and repeat CT scan in about 6 months but sooner if he has concerning symptoms.  He inquired about MRI for surveillance for lymphoma.  I discussed with him that MRI has no radiation exposure like CT.  However, it is quite long, claustrophobic, and costly.  He would like to continue with CT for now.  2.  His mother inquired about  her relatives having early breast cancer in their 79's.  I recommended that she has yearly mammogram.  I advised her to have her PCP refer her to either High Risk clinic or Genetic Counselor clinic here at the [facility].   3.  Follow up: In about 6 months but sooner if concerning symptoms.     The length of time of the face-to-face encounter was 20  minutes. More than 50% of time was spent counseling and coordination of care.

## 2012-08-10 NOTE — Telephone Encounter (Signed)
appts made and printed for pt aom °

## 2012-12-02 ENCOUNTER — Telehealth: Payer: Self-pay | Admitting: *Deleted

## 2012-12-02 NOTE — Telephone Encounter (Signed)
Patient called requesting results of sperm analysis performed at [facility] in [place].  Itay call [facility] for results and was instructed to call provider and results were faxed yesterday.  Test was performed yesterday morning.  Will notify providers of patient's request.  Agustin can be reached at [phone].

## 2012-12-02 NOTE — Telephone Encounter (Signed)
Called pt to inform we did receive results of Fertility test and he can come pick up copy at his convenience.  Instructed him to call [facility] w/ regards on how to interpret the test and assess his need to either continue to store his sperm (costs $500 per year) or not.  Wendy says he will call them and call us back if any problems.

## 2013-02-18 ENCOUNTER — Encounter (HOSPITAL_COMMUNITY): Payer: Self-pay

## 2013-02-18 ENCOUNTER — Ambulatory Visit (HOSPITAL_COMMUNITY)
Admission: RE | Admit: 2013-02-18 | Discharge: 2013-02-18 | Disposition: A | Discharge status: Home / Self Care | Payer: Federal, State, Local not specified - PPO | Source: Ambulatory Visit | Attending: Oncology | Admitting: Oncology

## 2013-02-18 ENCOUNTER — Other Ambulatory Visit (HOSPITAL_BASED_OUTPATIENT_CLINIC_OR_DEPARTMENT_OTHER): Payer: Federal, State, Local not specified - PPO

## 2013-02-18 DIAGNOSIS — C8190 Hodgkin lymphoma, unspecified, unspecified site: Secondary | ICD-10-CM

## 2013-02-18 DIAGNOSIS — Z9221 Personal history of antineoplastic chemotherapy: Secondary | ICD-10-CM | POA: Insufficient documentation

## 2013-02-18 LAB — COMPREHENSIVE METABOLIC PANEL (CC13)
ALT: 19 U/L (ref 0–55)
AST: 26 U/L (ref 5–34)
Albumin: 4.2 g/dL (ref 3.5–5.0)
BUN: 10.6 mg/dL (ref 7.0–26.0)
Calcium: 9.3 mg/dL (ref 8.4–10.4)
Chloride: 101 mEq/L (ref 98–107)
Potassium: 4.5 mEq/L (ref 3.5–5.1)

## 2013-02-18 LAB — CBC WITH DIFFERENTIAL/PLATELET
BASO%: 0.9 % (ref 0.0–2.0)
Basophils Absolute: 0 10*3/uL (ref 0.0–0.1)
EOS%: 6.7 % (ref 0.0–7.0)
HGB: 14.9 g/dL (ref 13.0–17.1)
MCH: 30.6 pg (ref 27.2–33.4)
MONO#: 0.4 10*3/uL (ref 0.1–0.9)
NEUT#: 3 10*3/uL (ref 1.5–6.5)
RDW: 12.5 % (ref 11.0–14.6)
WBC: 5.3 10*3/uL (ref 4.0–10.3)
lymph#: 1.4 10*3/uL (ref 0.9–3.3)

## 2013-02-18 MED ORDER — IOHEXOL 300 MG/ML  SOLN
100.0000 mL | Freq: Once | INTRAMUSCULAR | Status: AC | PRN
  Administered 2013-02-18: 100 mL via INTRAVENOUS

## 2013-02-19 ENCOUNTER — Telehealth: Payer: Self-pay | Admitting: Oncology

## 2013-02-19 ENCOUNTER — Ambulatory Visit (HOSPITAL_BASED_OUTPATIENT_CLINIC_OR_DEPARTMENT_OTHER): Payer: Federal, State, Local not specified - PPO | Admitting: Oncology

## 2013-02-19 ENCOUNTER — Encounter: Payer: Self-pay | Admitting: Oncology

## 2013-02-19 VITALS — BP 141/76 | HR 69 | Temp 97.9°F | Resp 20 | Ht 67.0 in | Wt 177.3 lb

## 2013-02-19 DIAGNOSIS — C8190 Hodgkin lymphoma, unspecified, unspecified site: Secondary | ICD-10-CM

## 2013-02-19 NOTE — Telephone Encounter (Signed)
gv pt appt schedule for November.  °

## 2013-02-19 NOTE — Progress Notes (Signed)
[facility]  Telephone:([phone] Fax:(336) [phone]   OFFICE PROGRESS NOTE   Cc:  PICKARD,WARREN TOM, MD  DIAGNOSIS: history of stage III Hodgkin's lymphoma   PAST THERAPY: s/p ABVD days 1 and 15 q 4 wks between 02/2011 and 07/2011 x6 cycles. He was evaluated by 2 different radiation oncologists; however, he deferred consolidative radiation.   CURRENT THERAPY: Watchful observation.   INTERVAL HISTORY: Andrew Mosley 22 y.o. male returns for regular follow up with his girlfriend.  He reports feeling relatively well. Continues to attend [employer].  Exercising and eating better.  He has been losing weight on purpose.    Patient denies fever, anorexia, weight loss, fatigue, headache, visual changes, confusion, drenching night sweats, palpable lymph node swelling, mucositis, odynophagia, dysphagia, nausea vomiting, jaundice, chest pain, palpitation, shortness of breath, dyspnea on exertion, productive cough, gum bleeding, epistaxis, hematemesis, hemoptysis, abdominal pain, abdominal swelling, early satiety, melena, hematochezia, hematuria, skin rash, spontaneous bleeding, joint swelling, joint pain, heat or cold intolerance, bowel bladder incontinence, back pain, focal motor weakness, paresthesia, depression, suicidal or homicidal ideation, feeling hopelessness.   Past Medical History  Diagnosis Date  . Hodgkin's lymphoma     hodgkins lymphoma dx 01/2011  . Hodgkin disease 02/07/2012    History reviewed. No pertinent past surgical history.  Current Outpatient Prescriptions  Medication Sig Dispense Refill  . loratadine (CLARITIN) 10 MG tablet Take 10 mg by mouth daily.      . Multiple Vitamin (MULTIVITAMIN) tablet Take 1 tablet by mouth daily.       No current facility-administered medications for this visit.    ALLERGIES:  has No Known Allergies.  REVIEW OF SYSTEMS:  The rest of the 14-point review of system was negative.   Filed Vitals:   02/19/13 0906  BP:  141/76  Pulse: 69  Temp: 97.9 F (36.6 C)  Resp: 20   Wt Readings from Last 3 Encounters:  02/19/13 177 lb 4.8 oz (80.423 kg)  08/10/12 188 lb 14.4 oz (85.684 kg)  05/08/12 179 lb 6.4 oz (81.375 kg)   ECOG Performance status: 0  PHYSICAL EXAMINATION:  General:  well-nourished man, in no acute distress.  Eyes:  no scleral icterus.  ENT:  There were no oropharyngeal lesions.  Neck was without thyromegaly.  Lymphatics:  Negative cervical, supraclavicular or axillary adenopathy.  Respiratory: lungs were clear bilaterally without wheezing or crackles.  Cardiovascular:  Regular rate and rhythm, S1/S2, without murmur, rub or gallop.  There was no pedal edema.  GI:  abdomen was soft, flat, nontender, nondistended, without organomegaly.  Muscoloskeletal:  no spinal tenderness of palpation of vertebral spine.  Skin exam was without echymosis, petichae.  Neuro exam was nonfocal.  Patient was able to get on and off exam table without assistance.  Gait was normal.  Patient was alerted and oriented.  Attention was good.   Language was appropriate.  Mood was normal without depression.  Speech was not pressured.  Thought content was not tangential.     LABORATORY/RADIOLOGY DATA:  Lab Results  Component Value Date   WBC 5.3 02/18/2013   HGB 14.9 02/18/2013   HCT 42.8 02/18/2013   PLT 194 02/18/2013   GLUCOSE 92 02/18/2013   ALKPHOS 51 02/18/2013   ALT 19 02/18/2013   AST 26 02/18/2013   NA 139 02/18/2013   K 4.5 02/18/2013   CL 101 02/18/2013   CREATININE 1.0 02/18/2013   BUN 10.6 02/18/2013   CO2 28 02/18/2013   INR  1.06 02/21/2012   IMAGING:    *RADIOLOGY REPORT*  Clinical Data: History of lymphoma. Diagnosed in 2012.  Hodgkin's. Chemotherapy complete. No complaints.  CT CHEST, ABDOMEN AND PELVIS WITH CONTRAST  Technique: Contiguous axial images of the chest abdomen and pelvis  were obtained after IV contrast administration.  Contrast: 100 ml Omnipaque-300  Comparison: 08/07/2012  CT CHEST  Findings: Lung  windows demonstrate no nodules or airspace  opacities.  Soft tissue windows demonstrate no axillary adenopathy. Normal  heart size, without pleural effusion. No central pulmonary  embolism, on this non-dedicated study. No middle mediastinal or  hilar adenopathy.  Redemonstration of anterior mediastinal adenopathy. Index left  sided nodal conglomerate measures 6.6 x 2.2 cm on image 22/series  2. Compared to 6.8 x 3.0 cm on the prior exam.  Right-sided portion measures 4.4 x 2.8 cm on image 23/series 2.  Decreased from 4.8 x 2.6 cm on the prior exam. Extension along the  left cardiac border is slightly decreased.  IMPRESSION:  1. Minimal decrease in size of mediastinal adenopathy. Given  absence of interval treatment and absence of hypermetabolism on  prior PET, this likely represents treated, non active lymphoma.  2. No new areas of disease.  CT ABDOMEN AND PELVIS  Findings: Normal liver. Improvement to resolution of  splenomegaly. The spleen measures 11.7 x 5.1 cm transverse and  12.9 cm cranial caudal. Splenic volume of 400 ml.  Normal stomach, pancreas, gallbladder, biliary tract, adrenal  glands, kidneys. No retroperitoneal or retrocrural adenopathy.  Normal colon, appendix, and terminal ileum. Normal small bowel  without abdominal ascites.  No pelvic adenopathy. Normal urinary bladder and prostate. No  significant free fluid.  No acute osseous abnormality.  IMPRESSION:  1. No acute process or evidence of active lymphoma within the  abdomen or pelvis.  2. Resolution of splenomegaly. The spleen is now upper normal in  size.  Original Report Authenticated By: Jeronimo Greaves, M.D.    ASSESSMENT AND PLAN:   1.  History of Hodgkin lymphoma:  CT scan showed decreasing mediastinal mass (which was not hot on post chemo PET scan).  Splenomegaly has resolved.  I recommended watchful observation. Since he is now 2 years out from diagnosis, recommend annual surveillance CT scan, but sooner  if concerning symptoms. He is in agreement with this plan.  2.  Follow up: In about 6 months but sooner if concerning symptoms.     The length of time of the face-to-face encounter was 30  minutes. More than 50% of time was spent counseling and coordination of care.

## 2013-04-09 NOTE — Telephone Encounter (Signed)
e

## 2013-04-29 ENCOUNTER — Ambulatory Visit (INDEPENDENT_AMBULATORY_CARE_PROVIDER_SITE_OTHER): Payer: Federal, State, Local not specified - PPO | Admitting: Family Medicine

## 2013-04-29 ENCOUNTER — Encounter: Payer: Self-pay | Admitting: Family Medicine

## 2013-04-29 VITALS — BP 138/78 | HR 64 | Temp 98.9°F | Resp 18 | Ht 67.0 in | Wt 172.0 lb

## 2013-04-29 DIAGNOSIS — F32A Depression, unspecified: Secondary | ICD-10-CM

## 2013-04-29 DIAGNOSIS — F329 Major depressive disorder, single episode, unspecified: Secondary | ICD-10-CM

## 2013-04-29 DIAGNOSIS — F3289 Other specified depressive episodes: Secondary | ICD-10-CM

## 2013-04-29 MED ORDER — ESCITALOPRAM OXALATE 10 MG PO TABS: 10.0000 mg | ORAL_TABLET | Freq: Every day | ORAL | Status: DC

## 2013-04-29 NOTE — Progress Notes (Signed)
  Subjective:    Patient ID: Andrew Mosley, male    DOB: [birth year], 22 y.o.   MRN: [id number]  HPI Patient has a history of Hodgkin's disease. This was diagnosed over 2 years ago.  Most recently followed up with his oncologist in May which continued to show remission of Hodgkin's.  However the patient reports daily depression for the last 2 years. He really felt like it began when he was diagnosed with Hodgkin's disease. His mind constantly perseverates over the diagnosis. His consulate worries it can come back.  He has a difficult time concentrating. He reports anhedonia. He reports depression. He reports constant daily anxiety. His family history is significant for a mother with depression. His grandfather committed suicide. The patient denies any suicidal ideation, hallucinations, or delusions.  He states that he just wants to enjoy life more.   He is in a stable monogamous relationship. He denies any social or relationship issues. He's doing well in school and graduates this fall.  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 In general life is going well. There is no precipitating cause for his depression. Past Medical History  Diagnosis Date  . Hodgkin's lymphoma     hodgkins lymphoma dx 01/2011  . Hodgkin disease 02/07/2012   Current Outpatient Prescriptions on File Prior to Visit  Medication Sig Dispense Refill  . loratadine (CLARITIN) 10 MG tablet Take 10 mg by mouth daily.      . Multiple Vitamin (MULTIVITAMIN) tablet Take 1 tablet by mouth daily.       No current facility-administered medications on file prior to visit.   No Known Allergies History   Social History  . Marital Status: Single    Spouse Name: N/A    Number of Children: N/A  . Years of Education: N/A   Occupational History  . Not on file.   Social History Main Topics  . Smoking status: Never Smoker   . Smokeless tobacco: Not on file  . Alcohol Use:   . Drug Use:   . Sexually Active:    Other Topics Concern   . Not on file   Social History Narrative  . No narrative on file      Review of Systems  All other systems reviewed and are negative.       Objective:   Physical Exam  Vitals reviewed. Constitutional: He is oriented to person, place, and time.  Neck: Neck supple. No thyromegaly present.  Cardiovascular: Normal rate, regular rhythm and normal heart sounds.   No murmur heard. Pulmonary/Chest: Effort normal and breath sounds normal. No respiratory distress. He has no wheezes. He has no rales.  Abdominal: Soft. Bowel sounds are normal. He exhibits no distension. There is no tenderness. There is no rebound.  Lymphadenopathy:    He has no cervical adenopathy.  Neurological: He is alert and oriented to person, place, and time.  Skin: Skin is warm. No rash noted. No erythema. No pallor.  Psychiatric: He has a normal mood and affect. His behavior is normal. Judgment and thought content normal.          Assessment & Plan:  1. Depression Begin Lexapro 10 mg by mouth daily.  Recheck in 4 weeks sooner if worsening. - escitalopram (LEXAPRO) 10 MG tablet; Take 1 tablet (10 mg total) by mouth daily.  Dispense: 30 tablet; Refill: 5

## 2013-08-04 ENCOUNTER — Telehealth: Payer: Self-pay | Admitting: Family Medicine

## 2013-08-04 NOTE — Telephone Encounter (Signed)
Dr. Tanya Nones please call him-he has questions for your about his medication and he prefers to talk with only you

## 2013-08-05 ENCOUNTER — Telehealth: Payer: Self-pay | Admitting: Family Medicine

## 2013-08-05 NOTE — Telephone Encounter (Signed)
Please call Randale Carvalho regarding her son Martha

## 2013-08-06 ENCOUNTER — Ambulatory Visit (INDEPENDENT_AMBULATORY_CARE_PROVIDER_SITE_OTHER): Payer: Federal, State, Local not specified - PPO | Admitting: Family Medicine

## 2013-08-06 ENCOUNTER — Encounter: Payer: Self-pay | Admitting: Family Medicine

## 2013-08-06 VITALS — BP 134/84 | HR 52 | Temp 98.4°F | Resp 18 | Ht 67.5 in | Wt 169.0 lb

## 2013-08-06 DIAGNOSIS — F411 Generalized anxiety disorder: Secondary | ICD-10-CM

## 2013-08-06 MED ORDER — BUSPIRONE HCL 7.5 MG PO TABS: 7.5000 mg | ORAL_TABLET | Freq: Two times a day (BID) | ORAL | Status: DC

## 2013-08-06 NOTE — Telephone Encounter (Signed)
Mom called about her son Andrew Mosley having Anxiety issues, she wanted him to be seen same day yesterday but did not have any appts at that time.Marland KitchenKeiron Iodice came in this moring to be seen.

## 2013-08-06 NOTE — Progress Notes (Signed)
Subjective:    Patient ID: Andrew Mosley, male    DOB: 1991-03-30, 22 y.o.   MRN: [id number]  HPI 04/29/13  Patient has a history of Hodgkin's disease. This was diagnosed over 2 years ago.  Most recently followed up with his oncologist in May which continued to show remission of Hodgkin's.  However the patient reports daily depression for the last 2 years. He really felt like it began when he was diagnosed with Hodgkin's disease. His mind constantly perseverates over the diagnosis. His consulate worries it can come back.  He has a difficult time concentrating. He reports anhedonia. He reports depression. He reports constant daily anxiety. His family history is significant for a mother with depression. His grandfather committed suicide. The patient denies any suicidal ideation, hallucinations, or delusions.  He states that he just wants to enjoy life more.   He is in a stable monogamous relationship. He denies any social or relationship issues. He's doing well in school and graduates this fall.  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 In general life is going well. There is no precipitating cause for his depression.   At that time, my plan was: 1. Depression Begin Lexapro 10 mg by mouth daily.  Recheck in 4 weeks sooner if worsening. - escitalopram (LEXAPRO) 10 MG tablet; Take 1 tablet (10 mg total) by mouth daily.  Dispense: 30 tablet; Refill: 5  08/06/13 Patient states he is doing worse. He felt like the Lexapro helped initially but now he feels anxious on daily basis. He is more irritable. He is more depressed. He does not have overt panic attacks but he feels constantly anxious and fearful. He perseverates and fixates on his lymphoma reoccurring.   Past Medical History  Diagnosis Date  . Hodgkin's lymphoma     hodgkins lymphoma dx 01/2011  . Hodgkin disease 02/07/2012   Current Outpatient Prescriptions on File Prior to Visit  Medication Sig Dispense Refill  . escitalopram (LEXAPRO) 10 MG  tablet Take 1 tablet (10 mg total) by mouth daily.  30 tablet  5  . loratadine (CLARITIN) 10 MG tablet Take 10 mg by mouth daily.      . Multiple Vitamin (MULTIVITAMIN) tablet Take 1 tablet by mouth daily.       No current facility-administered medications on file prior to visit.   No Known Allergies History   Social History  . Marital Status: Single    Spouse Name: N/A    Number of Children: N/A  . Years of Education: N/A   Occupational History  . Not on file.   Social History Main Topics  . Smoking status: Never Smoker   . Smokeless tobacco: Not on file  . Alcohol Use:   . Drug Use:   . Sexual Activity:    Other Topics Concern  . Not on file   Social History Narrative  . No narrative on file      Review of Systems  All other systems reviewed and are negative.       Objective:   Physical Exam  Vitals reviewed. Constitutional: He is oriented to person, place, and time.  Neck: Neck supple. No thyromegaly present.  Cardiovascular: Normal rate, regular rhythm and normal heart sounds.   No murmur heard. Pulmonary/Chest: Effort normal and breath sounds normal. No respiratory distress. He has no wheezes. He has no rales.  Abdominal: Soft. Bowel sounds are normal. He exhibits no distension. There is no tenderness. There is no rebound.  Lymphadenopathy:  He has no cervical adenopathy.  Neurological: He is alert and oriented to person, place, and time.  Skin: Skin is warm. No rash noted. No erythema. No pallor.  Psychiatric: He has a normal mood and affect. His behavior is normal. Judgment and thought content normal.          Assessment & Plan:  1. GAD (generalized anxiety disorder) We discussed options including adding Wellbutrin, switching Lexapro to Effexor. Ultimately the patient elected to add BuSpar 10.5 mg by mouth twice a day to Lexapro.  Recheck in 3 weeks.  If no better consider switching lexapro to Effexor - busPIRone (BUSPAR) 7.5 MG tablet; Take 1  tablet (7.5 mg total) by mouth 2 (two) times daily.  Dispense: 60 tablet; Refill: 3

## 2013-08-13 NOTE — Telephone Encounter (Signed)
sw pt made him aware that his appt was moved from 11/3 to 11/11. Pt stated that 08/31/13 was not an good day and that he needed an [place] appt. gv appt for 09/03/13 w/labs @ 12:30p and ov @ 1pm....td

## 2013-08-23 ENCOUNTER — Ambulatory Visit: Payer: Federal, State, Local not specified - PPO

## 2013-08-23 ENCOUNTER — Other Ambulatory Visit: Payer: Federal, State, Local not specified - PPO | Admitting: Lab

## 2013-08-31 ENCOUNTER — Other Ambulatory Visit: Payer: Federal, State, Local not specified - PPO | Admitting: Lab

## 2013-08-31 ENCOUNTER — Ambulatory Visit: Payer: Federal, State, Local not specified - PPO | Admitting: Hematology and Oncology

## 2013-09-01 ENCOUNTER — Telehealth: Payer: Self-pay | Admitting: Hematology and Oncology

## 2013-09-01 NOTE — Telephone Encounter (Signed)
Called pt and left message regarding appt being moved to a different time  on 11/14th  per Md

## 2013-09-03 ENCOUNTER — Telehealth: Payer: Self-pay | Admitting: Hematology and Oncology

## 2013-09-03 ENCOUNTER — Ambulatory Visit (HOSPITAL_BASED_OUTPATIENT_CLINIC_OR_DEPARTMENT_OTHER): Payer: Federal, State, Local not specified - PPO | Admitting: Hematology and Oncology

## 2013-09-03 ENCOUNTER — Other Ambulatory Visit (HOSPITAL_BASED_OUTPATIENT_CLINIC_OR_DEPARTMENT_OTHER): Payer: Federal, State, Local not specified - PPO | Admitting: Lab

## 2013-09-03 ENCOUNTER — Encounter: Payer: Self-pay | Admitting: Hematology and Oncology

## 2013-09-03 VITALS — BP 119/65 | HR 62 | Temp 99.3°F | Resp 18 | Ht 67.5 in | Wt 174.1 lb

## 2013-09-03 DIAGNOSIS — Z808 Family history of malignant neoplasm of other organs or systems: Secondary | ICD-10-CM

## 2013-09-03 DIAGNOSIS — F329 Major depressive disorder, single episode, unspecified: Secondary | ICD-10-CM

## 2013-09-03 DIAGNOSIS — C8190 Hodgkin lymphoma, unspecified, unspecified site: Secondary | ICD-10-CM

## 2013-09-03 LAB — CBC WITH DIFFERENTIAL/PLATELET
BASO%: 0.5 % (ref 0.0–2.0)
Eosinophils Absolute: 0.4 10*3/uL (ref 0.0–0.5)
MCHC: 35.1 g/dL (ref 32.0–36.0)
MONO#: 0.5 10*3/uL (ref 0.1–0.9)
MONO%: 7.9 % (ref 0.0–14.0)
NEUT#: 3.3 10*3/uL (ref 1.5–6.5)
RBC: 5.01 10*6/uL (ref 4.20–5.82)
RDW: 12.3 % (ref 11.0–14.6)
WBC: 5.9 10*3/uL (ref 4.0–10.3)

## 2013-09-03 LAB — COMPREHENSIVE METABOLIC PANEL (CC13)
ALT: 19 U/L (ref 0–55)
Albumin: 4.6 g/dL (ref 3.5–5.0)
Alkaline Phosphatase: 55 U/L (ref 40–150)
CO2: 25 mEq/L (ref 22–29)
Glucose: 76 mg/dl (ref 70–140)
Potassium: 4.3 mEq/L (ref 3.5–5.1)
Sodium: 141 mEq/L (ref 136–145)
Total Protein: 7.5 g/dL (ref 6.4–8.3)

## 2013-09-03 LAB — LACTATE DEHYDROGENASE (CC13): LDH: 127 U/L (ref 125–245)

## 2013-09-03 NOTE — Telephone Encounter (Signed)
appts made per 11/14 POF AVS and cal given pt adv CT will call to sch [facility]

## 2013-09-03 NOTE — Progress Notes (Signed)
[facility] OFFICE PROGRESS NOTE  Patient Care Team: Donita Brooks, MD as PCP - General (Family Medicine)  DIAGNOSIS: History of stage IIIB Hodgkin lymphoma  SUMMARY OF ONCOLOGIC HISTORY: This is a pleasant 22 year old gentleman diagnosed with Hodgkin lymphoma 2 years ago. According to the patient, he palpated an enlarged lymph node in the left supraclavicular region with associated night sweats. Further workup including imaging study and biopsies come from diagnosis of Hodgkin's lymphoma. Between May to October 2012 he completed 6 cycles of ABVD. The patient received 2 different opinion from radiation oncologist but subsequently deferred consolidation radiation therapy  INTERVAL HISTORY: Andrew Mosley 22 y.o. male returns for further followup. He had some situation no depression and currently on medications for this. He denies any recent fever, chills, night sweats or abnormal weight loss Denies any new palpable lymphadenopathy.  I have reviewed the past medical history, past surgical history, social history and family history with the patient and they are unchanged from previous note.  ALLERGIES:  has No Known Allergies.  MEDICATIONS:  Current Outpatient Prescriptions  Medication Sig Dispense Refill  . busPIRone (BUSPAR) 7.5 MG tablet Take 1 tablet (7.5 mg total) by mouth 2 (two) times daily.  60 tablet  3  . escitalopram (LEXAPRO) 10 MG tablet Take 1 tablet (10 mg total) by mouth daily.  30 tablet  5  . fish oil-omega-3 fatty acids 1000 MG capsule Take 2 g by mouth daily.      Marland Kitchen loratadine (CLARITIN) 10 MG tablet Take 10 mg by mouth daily.      . Multiple Vitamin (MULTIVITAMIN) tablet Take 1 tablet by mouth daily.       No current facility-administered medications for this visit.    REVIEW OF SYSTEMS:   Constitutional: Denies fevers, chills or abnormal weight loss Eyes: Denies blurriness of vision Ears, nose, mouth, throat, and face: Denies mucositis or sore  throat Respiratory: Denies cough, dyspnea or wheezes Cardiovascular: Denies palpitation, chest discomfort or lower extremity swelling Gastrointestinal:  Denies nausea, heartburn or change in bowel habits Skin: Denies abnormal skin rashes Lymphatics: Denies new lymphadenopathy or easy bruising Neurological:Denies numbness, tingling or new weaknesses Behavioral/Psych: Mood is stable, no new changes  All other systems were reviewed with the patient and are negative.  PHYSICAL EXAMINATION: ECOG PERFORMANCE STATUS: 0 - Asymptomatic  Filed Vitals:   09/03/13 1113  BP: 119/65  Pulse: 62  Temp: 99.3 F (37.4 C)  Resp: 18   Filed Weights   09/03/13 1113  Weight: 174 lb 1.6 oz (78.971 kg)    GENERAL:alert, no distress and comfortable SKIN: skin color, texture, turgor are normal, no rashes or significant lesions EYES: normal, Conjunctiva are pink and non-injected, sclera clear OROPHARYNX:no exudate, no erythema and lips, buccal mucosa, and tongue normal  NECK: supple, thyroid normal size, non-tender, without nodularity LYMPH:  no palpable lymphadenopathy in the cervical, axillary or inguinal LUNGS: clear to auscultation and percussion with normal breathing effort HEART: regular rate & rhythm and no murmurs and no lower extremity edema ABDOMEN:abdomen soft, non-tender and normal bowel sounds Musculoskeletal:no cyanosis of digits and no clubbing  NEURO: alert & oriented x 3 with fluent speech, no focal motor/sensory deficits  LABORATORY DATA:  I have reviewed the data as listed    Component Value Date/Time   NA 141 09/03/2013 1057   NA 138 05/08/2012 0810   K 4.3 09/03/2013 1057   K 4.7 05/08/2012 0810   CL 101 02/18/2013 0819   CL  100 05/08/2012 0810   CO2 25 09/03/2013 1057   CO2 26 05/08/2012 0810   GLUCOSE 76 09/03/2013 1057   GLUCOSE 92 02/18/2013 0819   GLUCOSE 94 05/08/2012 0810   BUN 13.5 09/03/2013 1057   BUN 16 05/08/2012 0810   CREATININE 0.9 09/03/2013 1057   CREATININE  0.99 05/08/2012 0810   CALCIUM 9.9 09/03/2013 1057   CALCIUM 9.6 05/08/2012 0810   PROT 7.5 09/03/2013 1057   PROT 6.7 05/08/2012 0810   ALBUMIN 4.6 09/03/2013 1057   ALBUMIN 4.8 05/08/2012 0810   AST 27 09/03/2013 1057   AST 22 05/08/2012 0810   ALT 19 09/03/2013 1057   ALT 14 05/08/2012 0810   ALKPHOS 55 09/03/2013 1057   ALKPHOS 45 05/08/2012 0810   BILITOT 0.77 09/03/2013 1057   BILITOT 1.2 05/08/2012 0810    No results found for this basename: SPEP, UPEP,  kappa and lambda light chains    Lab Results  Component Value Date   WBC 5.9 09/03/2013   NEUTROABS 3.3 09/03/2013   HGB 15.3 09/03/2013   HCT 43.6 09/03/2013   MCV 87.0 09/03/2013   PLT 191 09/03/2013      Chemistry      Component Value Date/Time   NA 141 09/03/2013 1057   NA 138 05/08/2012 0810   K 4.3 09/03/2013 1057   K 4.7 05/08/2012 0810   CL 101 02/18/2013 0819   CL 100 05/08/2012 0810   CO2 25 09/03/2013 1057   CO2 26 05/08/2012 0810   BUN 13.5 09/03/2013 1057   BUN 16 05/08/2012 0810   CREATININE 0.9 09/03/2013 1057   CREATININE 0.99 05/08/2012 0810      Component Value Date/Time   CALCIUM 9.9 09/03/2013 1057   CALCIUM 9.6 05/08/2012 0810   ALKPHOS 55 09/03/2013 1057   ALKPHOS 45 05/08/2012 0810   AST 27 09/03/2013 1057   AST 22 05/08/2012 0810   ALT 19 09/03/2013 1057   ALT 14 05/08/2012 0810   BILITOT 0.77 09/03/2013 1057   BILITOT 1.2 05/08/2012 0810     ASSESSMENT & PLAN:  #1 Hodgkin lymphoma The patient has achieved good response to treatment. From his last CT scan, there were still persistent lymphadenopathy in the chest although they were reducing in size. I am concerned about persistent residual disease. I recommend ordering CT scan of the chest, abdomen, and pelvis for further followup. He is in agreement. #2 depression His mood is stable. We'll continue her treatment as directed by his primary care provider #3 family history of melanoma I recommend the patient to wear sunscreen and avoid  excessive sun exposure due to family history of melanoma and his personal history of lymphoma. I also recommend vitamin D supplement.  Orders Placed This Encounter  Procedures  . CT Abdomen Pelvis W Contrast    Standing Status: Future     Number of Occurrences:      Standing Expiration Date: 12/04/2014    Order Specific Question:  Reason for Exam (SYMPTOM  OR DIAGNOSIS REQUIRED)    Answer:  staging lymphoma    Order Specific Question:  Preferred imaging location?    Answer:  [facility]  . CT Chest W Contrast    Standing Status: Future     Number of Occurrences:      Standing Expiration Date: 11/03/2014    Order Specific Question:  Reason for Exam (SYMPTOM  OR DIAGNOSIS REQUIRED)    Answer:  Hx Hodgkin's lymphoma    Order Specific  Question:  Preferred imaging location?    Answer:  [facility]  . CBC with Differential    Standing Status: Future     Number of Occurrences:      Standing Expiration Date: 05/26/2014  . Comprehensive metabolic panel    Standing Status: Future     Number of Occurrences:      Standing Expiration Date: 09/03/2014  . Lactate dehydrogenase    Standing Status: Future     Number of Occurrences:      Standing Expiration Date: 09/03/2014   All questions were answered. The patient knows to call the clinic with any problems, questions or concerns. No barriers to learning was detected. I spent 25 minutes counseling the patient face to face. The total time spent in the appointment was 40 minutes and more than 50% was on counseling and review of test results     [facility], Nicoya Friel, MD 09/03/2013 1:58 PM

## 2013-10-26 ENCOUNTER — Other Ambulatory Visit: Payer: Self-pay | Admitting: Family Medicine

## 2014-01-17 ENCOUNTER — Other Ambulatory Visit: Payer: Self-pay | Admitting: Family Medicine

## 2014-01-17 MED ORDER — VENLAFAXINE HCL ER 75 MG PO CP24: ORAL_CAPSULE | ORAL | Status: DC

## 2014-01-23 ENCOUNTER — Other Ambulatory Visit: Payer: Self-pay | Admitting: Family Medicine

## 2014-02-23 ENCOUNTER — Telehealth: Payer: Self-pay | Admitting: *Deleted

## 2014-02-23 NOTE — Telephone Encounter (Signed)
Spoke with patient, he had requested to cancel CT, OK with Dr Alvy Bimler. Will do labs when she sees him

## 2014-03-02 ENCOUNTER — Ambulatory Visit (HOSPITAL_COMMUNITY): Payer: Federal, State, Local not specified - PPO

## 2014-03-04 ENCOUNTER — Ambulatory Visit (HOSPITAL_COMMUNITY)
Admission: RE | Admit: 2014-03-04 | Discharge: 2014-03-04 | Disposition: A | Discharge status: Home / Self Care | Payer: Federal, State, Local not specified - PPO | Source: Ambulatory Visit | Attending: Hematology and Oncology | Admitting: Hematology and Oncology

## 2014-03-04 ENCOUNTER — Other Ambulatory Visit: Payer: Federal, State, Local not specified - PPO

## 2014-03-04 ENCOUNTER — Ambulatory Visit (HOSPITAL_BASED_OUTPATIENT_CLINIC_OR_DEPARTMENT_OTHER): Payer: Federal, State, Local not specified - PPO | Admitting: Hematology and Oncology

## 2014-03-04 ENCOUNTER — Telehealth: Payer: Self-pay | Admitting: Hematology and Oncology

## 2014-03-04 ENCOUNTER — Encounter: Payer: Self-pay | Admitting: Hematology and Oncology

## 2014-03-04 ENCOUNTER — Telehealth: Payer: Self-pay | Admitting: *Deleted

## 2014-03-04 VITALS — BP 132/76 | HR 73 | Temp 98.5°F | Resp 19 | Ht 67.0 in | Wt 179.1 lb

## 2014-03-04 DIAGNOSIS — C8190 Hodgkin lymphoma, unspecified, unspecified site: Secondary | ICD-10-CM

## 2014-03-04 DIAGNOSIS — F329 Major depressive disorder, single episode, unspecified: Secondary | ICD-10-CM

## 2014-03-04 DIAGNOSIS — F411 Generalized anxiety disorder: Secondary | ICD-10-CM

## 2014-03-04 DIAGNOSIS — F3289 Other specified depressive episodes: Secondary | ICD-10-CM

## 2014-03-04 DIAGNOSIS — Z808 Family history of malignant neoplasm of other organs or systems: Secondary | ICD-10-CM

## 2014-03-04 LAB — CBC WITH DIFFERENTIAL/PLATELET
BASO%: 0.7 % (ref 0.0–2.0)
Basophils Absolute: 0 10*3/uL (ref 0.0–0.1)
EOS%: 13.1 % — AB (ref 0.0–7.0)
Eosinophils Absolute: 0.8 10*3/uL — ABNORMAL HIGH (ref 0.0–0.5)
HCT: 42.4 % (ref 38.4–49.9)
HGB: 15.1 g/dL (ref 13.0–17.1)
LYMPH#: 1.6 10*3/uL (ref 0.9–3.3)
LYMPH%: 27.7 % (ref 14.0–49.0)
MCH: 30.3 pg (ref 27.2–33.4)
MCHC: 35.6 g/dL (ref 32.0–36.0)
MCV: 85.1 fL (ref 79.3–98.0)
MONO#: 0.5 10*3/uL (ref 0.1–0.9)
MONO%: 8.3 % (ref 0.0–14.0)
NEUT#: 2.9 10*3/uL (ref 1.5–6.5)
NEUT%: 50.2 % (ref 39.0–75.0)
PLATELETS: 186 10*3/uL (ref 140–400)
RBC: 4.98 10*6/uL (ref 4.20–5.82)
RDW: 12.2 % (ref 11.0–14.6)
WBC: 5.8 10*3/uL (ref 4.0–10.3)

## 2014-03-04 LAB — COMPREHENSIVE METABOLIC PANEL (CC13)
ALK PHOS: 52 U/L (ref 40–150)
ALT: 27 U/L (ref 0–55)
AST: 32 U/L (ref 5–34)
Albumin: 4.6 g/dL (ref 3.5–5.0)
Anion Gap: 14 mEq/L — ABNORMAL HIGH (ref 3–11)
BUN: 16.3 mg/dL (ref 7.0–26.0)
CALCIUM: 10.2 mg/dL (ref 8.4–10.4)
CHLORIDE: 103 meq/L (ref 98–109)
CO2: 25 mEq/L (ref 22–29)
CREATININE: 1 mg/dL (ref 0.7–1.3)
Glucose: 91 mg/dl (ref 70–140)
Potassium: 4.3 mEq/L (ref 3.5–5.1)
Sodium: 142 mEq/L (ref 136–145)
Total Bilirubin: 0.71 mg/dL (ref 0.20–1.20)
Total Protein: 7.4 g/dL (ref 6.4–8.3)

## 2014-03-04 LAB — LACTATE DEHYDROGENASE (CC13): LDH: 145 U/L (ref 125–245)

## 2014-03-04 NOTE — Telephone Encounter (Signed)
Left pt VM to return nurse's call regarding results.

## 2014-03-04 NOTE — Telephone Encounter (Signed)
Message copied by Cathlean Cower on Fri Mar 04, 2014  9:39 AM ------      Message from: [facility], [place]      Created: Fri Mar 04, 2014  8:56 AM      Regarding: CMET and XRAY results       Please let him they are all normal      ----- Message -----         From: Rad Results In Interface         Sent: 03/04/2014   8:55 AM           To: Heath Lark, MD                   ------

## 2014-03-04 NOTE — Telephone Encounter (Signed)
Informed pt of results CMET and Xray normal.  He verbalized understanding.

## 2014-03-04 NOTE — Progress Notes (Signed)
[place] OFFICE PROGRESS NOTE  Patient Care Team: Susy Frizzle, MD as PCP - General (Family Medicine)  DIAGNOSIS: Hodgkin's lymphoma, no evidence of recurrence  SUMMARY OF ONCOLOGIC HISTORY: This is a pleasant 23 year old gentleman diagnosed with Hodgkin lymphoma 2 years ago. According to the patient, he palpated an enlarged lymph node in the left supraclavicular region with associated night sweats. Further workup including imaging study and biopsies come from diagnosis of Hodgkin's lymphoma. Between May to October 2012 he completed 6 cycles of ABVD. The patient received 2 different opinion from radiation oncologist but subsequently deferred consolidation radiation therapy  INTERVAL HISTORY: Berdine Dance 23 y.o. male returns for  Further followup. The patient wants to cancel all routine imaging study. He feels well. He has gained some weight. Denies any new lymphadenopathy. He recently had an anxiety to improve with new medication for this. He denies any recent fever, chills, night sweats or abnormal weight loss   I have reviewed the past medical history, past surgical history, social history and family history with the patient and they are unchanged from previous note.  ALLERGIES:  has No Known Allergies.  MEDICATIONS:  Current Outpatient Prescriptions  Medication Sig Dispense Refill  . fish oil-omega-3 fatty acids 1000 MG capsule Take 2 g by mouth daily.      Marland Kitchen loratadine (CLARITIN) 10 MG tablet Take 10 mg by mouth daily.      . Multiple Vitamin (MULTIVITAMIN) tablet Take 1 tablet by mouth daily.      Marland Kitchen venlafaxine XR (EFFEXOR-XR) 75 MG 24 hr capsule Take 1 pill in the morning for 1 week, then increase to 2 pills every morning.  60 capsule  3   No current facility-administered medications for this visit.    REVIEW OF SYSTEMS:   Constitutional: Denies fevers, chills or abnormal weight loss Eyes: Denies blurriness of vision Ears, nose, mouth, throat, and  face: Denies mucositis or sore throat Respiratory: Denies cough, dyspnea or wheezes Cardiovascular: Denies palpitation, chest discomfort or lower extremity swelling Gastrointestinal:  Denies nausea, heartburn or change in bowel habits Skin: Denies abnormal skin rashes Lymphatics: Denies new lymphadenopathy or easy bruising Neurological:Denies numbness, tingling or new weaknesses Behavioral/Psych: Mood is stable, no new changes  All other systems were reviewed with the patient and are negative.  PHYSICAL EXAMINATION: ECOG PERFORMANCE STATUS: 0 - Asymptomatic  Filed Vitals:   03/04/14 0821  BP: 132/76  Pulse: 73  Temp: 98.5 F (36.9 C)  Resp: 19   Filed Weights   03/04/14 0821  Weight: 179 lb 1.6 oz (81.239 kg)    GENERAL:alert, no distress and comfortable SKIN: skin color, texture, turgor are normal, no rashes or significant lesions EYES: normal, Conjunctiva are pink and non-injected, sclera clear OROPHARYNX:no exudate, no erythema and lips, buccal mucosa, and tongue normal  NECK: supple, thyroid normal size, non-tender, without nodularity LYMPH:  no palpable lymphadenopathy in the cervical, axillary or inguinal LUNGS: clear to auscultation and percussion with normal breathing effort HEART: regular rate & rhythm and no murmurs and no lower extremity edema ABDOMEN:abdomen soft, non-tender and normal bowel sounds Musculoskeletal:no cyanosis of digits and no clubbing  NEURO: alert & oriented x 3 with fluent speech, no focal motor/sensory deficits  LABORATORY DATA:  I have reviewed the data as listed    Component Value Date/Time   NA 142 03/04/2014 0810   NA 138 05/08/2012 0810   K 4.3 03/04/2014 0810   K 4.7 05/08/2012 0810   CL 101 02/18/2013  0819   CL 100 05/08/2012 0810   CO2 25 03/04/2014 0810   CO2 26 05/08/2012 0810   GLUCOSE 91 03/04/2014 0810   GLUCOSE 92 02/18/2013 0819   GLUCOSE 94 05/08/2012 0810   BUN 16.3 03/04/2014 0810   BUN 16 05/08/2012 0810   CREATININE 1.0  03/04/2014 0810   CREATININE 0.99 05/08/2012 0810   CALCIUM 10.2 03/04/2014 0810   CALCIUM 9.6 05/08/2012 0810   PROT 7.4 03/04/2014 0810   PROT 6.7 05/08/2012 0810   ALBUMIN 4.6 03/04/2014 0810   ALBUMIN 4.8 05/08/2012 0810   AST 32 03/04/2014 0810   AST 22 05/08/2012 0810   ALT 27 03/04/2014 0810   ALT 14 05/08/2012 0810   ALKPHOS 52 03/04/2014 0810   ALKPHOS 45 05/08/2012 0810   BILITOT 0.71 03/04/2014 0810   BILITOT 1.2 05/08/2012 0810    No results found for this basename: SPEP,  UPEP,   kappa and lambda light chains    Lab Results  Component Value Date   WBC 5.8 03/04/2014   NEUTROABS 2.9 03/04/2014   HGB 15.1 03/04/2014   HCT 42.4 03/04/2014   MCV 85.1 03/04/2014   PLT 186 03/04/2014      Chemistry      Component Value Date/Time   NA 142 03/04/2014 0810   NA 138 05/08/2012 0810   K 4.3 03/04/2014 0810   K 4.7 05/08/2012 0810   CL 101 02/18/2013 0819   CL 100 05/08/2012 0810   CO2 25 03/04/2014 0810   CO2 26 05/08/2012 0810   BUN 16.3 03/04/2014 0810   BUN 16 05/08/2012 0810   CREATININE 1.0 03/04/2014 0810   CREATININE 0.99 05/08/2012 0810      Component Value Date/Time   CALCIUM 10.2 03/04/2014 0810   CALCIUM 9.6 05/08/2012 0810   ALKPHOS 52 03/04/2014 0810   ALKPHOS 45 05/08/2012 0810   AST 32 03/04/2014 0810   AST 22 05/08/2012 0810   ALT 27 03/04/2014 0810   ALT 14 05/08/2012 0810   BILITOT 0.71 03/04/2014 0810   BILITOT 1.2 05/08/2012 0810     RADIOGRAPHIC STUDIES:  I have personally reviewed the radiological images as listed and agreed with the findings in the report. Dg Chest 2 View  03/04/2014   CLINICAL DATA:  Hodgkin's lymphoma.  EXAM: CHEST  2 VIEW  COMPARISON:  None.  FINDINGS: The heart size and mediastinal contours are within normal limits. Both lungs are clear. No pneumothorax or pleural effusion is noted. The visualized skeletal structures are unremarkable.  IMPRESSION: No acute cardiopulmonary abnormality seen.   Electronically Signed   By: Sabino Dick M.D.   On: 03/04/2014  08:53    ASSESSMENT & PLAN:  #1 Hodgkin lymphoma The patient has achieved good response to treatment. From his last CT scan, there were still persistent lymphadenopathy in the chest although they were reducing in size. I am concerned about persistent residual disease. I recommend ordering CT scan of the chest, abdomen, and pelvis for further followup. The patient wanted to defer them. The very minimum I recommend a chest x-ray and he agreed. Chest x-ray is normal. I will see him in 6 months with history, physical examination and chest x-ray in the future. #2 depression and anxiety His mood is stable. Overall, he is improving on effexor #3 family history of melanoma I recommend the patient to wear sunscreen and avoid excessive sun exposure due to family history of melanoma and his personal history of lymphoma. I also recommend  vitamin D supplement.  Orders Placed This Encounter  Procedures  . DG Chest 2 View    Standing Status: Future     Number of Occurrences: 1     Standing Expiration Date: 03/04/2015    Order Specific Question:  Reason for exam:    Answer:  lymphoma in mediastinum, new baseline    Order Specific Question:  Preferred imaging location?    Answer:  [facility]   All questions were answered. The patient knows to call the clinic with any problems, questions or concerns. No barriers to learning was detected. I spent 15 minutes counseling the patient face to face. The total time spent in the appointment was 20 minutes and more than 50% was on counseling and review of test results     Heath Lark, MD 03/04/2014 3:05 PM

## 2014-03-04 NOTE — Telephone Encounter (Signed)
gv adn printed appt sched and avs for pt for Nov

## 2014-05-17 ENCOUNTER — Other Ambulatory Visit: Payer: Self-pay | Admitting: Family Medicine

## 2014-08-25 ENCOUNTER — Other Ambulatory Visit: Payer: Self-pay | Admitting: Hematology and Oncology

## 2014-08-25 DIAGNOSIS — C8190 Hodgkin lymphoma, unspecified, unspecified site: Secondary | ICD-10-CM

## 2014-08-26 ENCOUNTER — Other Ambulatory Visit (HOSPITAL_BASED_OUTPATIENT_CLINIC_OR_DEPARTMENT_OTHER): Payer: Federal, State, Local not specified - PPO

## 2014-08-26 ENCOUNTER — Encounter: Payer: Self-pay | Admitting: Hematology and Oncology

## 2014-08-26 ENCOUNTER — Ambulatory Visit (HOSPITAL_BASED_OUTPATIENT_CLINIC_OR_DEPARTMENT_OTHER): Payer: Federal, State, Local not specified - PPO | Admitting: Hematology and Oncology

## 2014-08-26 VITALS — BP 129/71 | HR 59 | Temp 99.1°F | Resp 18 | Ht 67.0 in | Wt 183.5 lb

## 2014-08-26 DIAGNOSIS — Z23 Encounter for immunization: Secondary | ICD-10-CM

## 2014-08-26 DIAGNOSIS — Z8571 Personal history of Hodgkin lymphoma: Secondary | ICD-10-CM

## 2014-08-26 DIAGNOSIS — C8190 Hodgkin lymphoma, unspecified, unspecified site: Secondary | ICD-10-CM

## 2014-08-26 LAB — CBC WITH DIFFERENTIAL/PLATELET
BASO%: 0.1 % (ref 0.0–2.0)
Basophils Absolute: 0 10*3/uL (ref 0.0–0.1)
EOS%: 4 % (ref 0.0–7.0)
Eosinophils Absolute: 0.2 10*3/uL (ref 0.0–0.5)
HCT: 42.8 % (ref 38.4–49.9)
HGB: 14.5 g/dL (ref 13.0–17.1)
LYMPH%: 26.7 % (ref 14.0–49.0)
MCH: 29.8 pg (ref 27.2–33.4)
MCHC: 33.8 g/dL (ref 32.0–36.0)
MCV: 88.1 fL (ref 79.3–98.0)
MONO#: 0.5 10*3/uL (ref 0.1–0.9)
MONO%: 9.4 % (ref 0.0–14.0)
NEUT%: 59.8 % (ref 39.0–75.0)
NEUTROS ABS: 3.4 10*3/uL (ref 1.5–6.5)
Platelets: 193 10*3/uL (ref 140–400)
RBC: 4.86 10*6/uL (ref 4.20–5.82)
RDW: 12.3 % (ref 11.0–14.6)
WBC: 5.7 10*3/uL (ref 4.0–10.3)
lymph#: 1.5 10*3/uL (ref 0.9–3.3)

## 2014-08-26 LAB — COMPREHENSIVE METABOLIC PANEL (CC13)
ALBUMIN: 4.5 g/dL (ref 3.5–5.0)
ALK PHOS: 44 U/L (ref 40–150)
ALT: 22 U/L (ref 0–55)
AST: 23 U/L (ref 5–34)
Anion Gap: 9 mEq/L (ref 3–11)
BUN: 11 mg/dL (ref 7.0–26.0)
CO2: 28 mEq/L (ref 22–29)
Calcium: 9.5 mg/dL (ref 8.4–10.4)
Chloride: 104 mEq/L (ref 98–109)
Creatinine: 0.9 mg/dL (ref 0.7–1.3)
Glucose: 81 mg/dl (ref 70–140)
Potassium: 4.5 mEq/L (ref 3.5–5.1)
SODIUM: 141 meq/L (ref 136–145)
TOTAL PROTEIN: 7 g/dL (ref 6.4–8.3)
Total Bilirubin: 0.75 mg/dL (ref 0.20–1.20)

## 2014-08-26 LAB — LACTATE DEHYDROGENASE (CC13): LDH: 128 U/L (ref 125–245)

## 2014-08-26 MED ORDER — INFLUENZA VAC SPLIT QUAD 0.5 ML IM SUSY
0.5000 mL | PREFILLED_SYRINGE | Freq: Once | INTRAMUSCULAR | Status: AC
  Administered 2014-08-26: 0.5 mL via INTRAMUSCULAR
  Filled 2014-08-26: qty 0.5

## 2014-08-26 NOTE — Assessment & Plan Note (Signed)
Clinically, he has no evidence of disease. The patient is cured. He has moved to [facility] I recommend referral to [facility] for future follow-up. I would recommend only history, physical examination and blood work only with periodic chest his x-ray as indicated. We discussed the importance of preventive care and reviewed the vaccination programs. He does not have any prior allergic reactions to influenza vaccination. He agrees to proceed with influenza vaccination today and we will administer it today at the clinic.

## 2014-08-26 NOTE — Progress Notes (Signed)
[facility] OFFICE PROGRESS NOTE  Patient Care Team: Susy Frizzle, MD as PCP - General (Family Medicine)  SUMMARY OF ONCOLOGIC HISTORY:  This is a pleasant 23 year old gentleman diagnosed with Hodgkin lymphoma 2 years ago. According to the patient, he palpated an enlarged lymph node in the left supraclavicular region with associated night sweats. Further workup including imaging study and biopsies come from diagnosis of Hodgkin's lymphoma. Between May to October 2012 he completed 6 cycles of ABVD. The patient received 2 different opinion from radiation oncologist but subsequently deferred consolidation radiation therapy  INTERVAL HISTORY: Please see below for problem oriented charting. He feels well. Denies new lymphadenopathy.  REVIEW OF SYSTEMS:   Constitutional: Denies fevers, chills or abnormal weight loss Eyes: Denies blurriness of vision Ears, nose, mouth, throat, and face: Denies mucositis or sore throat Respiratory: Denies cough, dyspnea or wheezes Cardiovascular: Denies palpitation, chest discomfort or lower extremity swelling Gastrointestinal:  Denies nausea, heartburn or change in bowel habits Skin: Denies abnormal skin rashes Lymphatics: Denies new lymphadenopathy or easy bruising Neurological:Denies numbness, tingling or new weaknesses Behavioral/Psych: Mood is stable, no new changes  All other systems were reviewed with the patient and are negative.  I have reviewed the past medical history, past surgical history, social history and family history with the patient and they are unchanged from previous note.  ALLERGIES:  has No Known Allergies.  MEDICATIONS:  Current Outpatient Prescriptions  Medication Sig Dispense Refill  . fish oil-omega-3 fatty acids 1000 MG capsule Take 2 g by mouth daily.    Marland Kitchen loratadine (CLARITIN) 10 MG tablet Take 10 mg by mouth daily.    . Multiple Vitamin (MULTIVITAMIN) tablet Take 1 tablet by mouth daily.    Marland Kitchen  venlafaxine XR (EFFEXOR-XR) 75 MG 24 hr capsule 2 caps po qd 60 capsule 3   No current facility-administered medications for this visit.    PHYSICAL EXAMINATION: ECOG PERFORMANCE STATUS: 0 - Asymptomatic  Filed Vitals:   08/26/14 0802  BP: 129/71  Pulse: 59  Temp: 99.1 F (37.3 C)  Resp: 18   Filed Weights   08/26/14 0802  Weight: 183 lb 8 oz (83.235 kg)    GENERAL:alert, no distress and comfortable SKIN: skin color, texture, turgor are normal, no rashes or significant lesions EYES: normal, Conjunctiva are pink and non-injected, sclera clear OROPHARYNX:no exudate, no erythema and lips, buccal mucosa, and tongue normal  NECK: supple, thyroid normal size, non-tender, without nodularity LYMPH:  no palpable lymphadenopathy in the cervical, axillary or inguinal LUNGS: clear to auscultation and percussion with normal breathing effort HEART: regular rate & rhythm and no murmurs and no lower extremity edema ABDOMEN:abdomen soft, non-tender and normal bowel sounds Musculoskeletal:no cyanosis of digits and no clubbing  NEURO: alert & oriented x 3 with fluent speech, no focal motor/sensory deficits  LABORATORY DATA:  I have reviewed the data as listed    Component Value Date/Time   NA 142 03/04/2014 0810   NA 138 05/08/2012 0810   K 4.3 03/04/2014 0810   K 4.7 05/08/2012 0810   CL 101 02/18/2013 0819   CL 100 05/08/2012 0810   CO2 25 03/04/2014 0810   CO2 26 05/08/2012 0810   GLUCOSE 91 03/04/2014 0810   GLUCOSE 92 02/18/2013 0819   GLUCOSE 94 05/08/2012 0810   BUN 16.3 03/04/2014 0810   BUN 16 05/08/2012 0810   CREATININE 1.0 03/04/2014 0810   CREATININE 0.99 05/08/2012 0810   CALCIUM 10.2 03/04/2014 0810   CALCIUM 9.6  05/08/2012 0810   PROT 7.4 03/04/2014 0810   PROT 6.7 05/08/2012 0810   ALBUMIN 4.6 03/04/2014 0810   ALBUMIN 4.8 05/08/2012 0810   AST 32 03/04/2014 0810   AST 22 05/08/2012 0810   ALT 27 03/04/2014 0810   ALT 14 05/08/2012 0810   ALKPHOS 52  03/04/2014 0810   ALKPHOS 45 05/08/2012 0810   BILITOT 0.71 03/04/2014 0810   BILITOT 1.2 05/08/2012 0810    No results found for: SPEP, UPEP  Lab Results  Component Value Date   WBC 5.7 08/26/2014   NEUTROABS 3.4 08/26/2014   HGB 14.5 08/26/2014   HCT 42.8 08/26/2014   MCV 88.1 08/26/2014   PLT 193 08/26/2014      Chemistry      Component Value Date/Time   NA 142 03/04/2014 0810   NA 138 05/08/2012 0810   K 4.3 03/04/2014 0810   K 4.7 05/08/2012 0810   CL 101 02/18/2013 0819   CL 100 05/08/2012 0810   CO2 25 03/04/2014 0810   CO2 26 05/08/2012 0810   BUN 16.3 03/04/2014 0810   BUN 16 05/08/2012 0810   CREATININE 1.0 03/04/2014 0810   CREATININE 0.99 05/08/2012 0810      Component Value Date/Time   CALCIUM 10.2 03/04/2014 0810   CALCIUM 9.6 05/08/2012 0810   ALKPHOS 52 03/04/2014 0810   ALKPHOS 45 05/08/2012 0810   AST 32 03/04/2014 0810   AST 22 05/08/2012 0810   ALT 27 03/04/2014 0810   ALT 14 05/08/2012 0810   BILITOT 0.71 03/04/2014 0810   BILITOT 1.2 05/08/2012 0810      ASSESSMENT & PLAN:  Hodgkin disease Clinically, he has no evidence of disease. The patient is cured. He has moved to [facility] I recommend referral to [facility] for future follow-up. I would recommend only history, physical examination and blood work only with periodic chest his x-ray as indicated. We discussed the importance of preventive care and reviewed the vaccination programs. He does not have any prior allergic reactions to influenza vaccination. He agrees to proceed with influenza vaccination today and we will administer it today at the clinic.    No orders of the defined types were placed in this encounter.   All questions were answered. The patient knows to call the clinic with any problems, questions or concerns. No barriers to learning was detected. I spent 15 minutes counseling the patient face to face. The total time spent in the appointment was 20 minutes and more  than 50% was on counseling and review of test results     [facility], [place], MD 08/26/2014 8:19 AM

## 2014-08-26 NOTE — Addendum Note (Signed)
Addended by: Randolm Idol on: 08/26/2014 08:27 AM   Modules accepted: Orders

## 2014-09-15 ENCOUNTER — Other Ambulatory Visit: Payer: Self-pay | Admitting: Family Medicine

## 2015-01-10 ENCOUNTER — Other Ambulatory Visit: Payer: Self-pay | Admitting: Family Medicine

## 2015-01-25 ENCOUNTER — Other Ambulatory Visit: Payer: Self-pay | Admitting: Family Medicine

## 2015-01-26 ENCOUNTER — Telehealth: Payer: Self-pay | Admitting: Family Medicine

## 2015-01-26 MED ORDER — VENLAFAXINE HCL ER 75 MG PO CP24
150.0000 mg | ORAL_CAPSULE | Freq: Every day | ORAL | Status: DC
Start: 2015-01-26 — End: 2016-05-27

## 2015-01-26 MED ORDER — VENLAFAXINE HCL ER 75 MG PO CP24: 150.0000 mg | ORAL_CAPSULE | Freq: Every day | ORAL | Status: DC

## 2015-01-26 NOTE — Telephone Encounter (Signed)
Med sent to pharm 

## 2015-01-26 NOTE — Telephone Encounter (Signed)
ok 

## 2015-01-26 NOTE — Telephone Encounter (Signed)
?   OK to Refill  - LOV 08/06/2013

## 2015-01-26 NOTE — Telephone Encounter (Signed)
[phone] Pt is needing a refill on venlafaxine XR (EFFEXOR-XR) 75 MG 24 hr capsule ( he went to pharmacy yesterday to get refill and he didn't realize he was out of them and he states that he is completley out and if he misses a dose that he has some weird side effects) [facility]  [employer] also wants dr pickard to know that he is seeing behavioral specialist next week

## 2015-12-13 ENCOUNTER — Telehealth: Payer: Self-pay | Admitting: Hematology and Oncology

## 2015-12-13 NOTE — Telephone Encounter (Signed)
Mailed pts medical records to [place]

## 2016-05-27 ENCOUNTER — Encounter: Payer: Self-pay | Admitting: Family Medicine

## 2016-05-27 ENCOUNTER — Ambulatory Visit (INDEPENDENT_AMBULATORY_CARE_PROVIDER_SITE_OTHER): Payer: Federal, State, Local not specified - PPO | Admitting: Family Medicine

## 2016-05-27 VITALS — BP 122/74 | HR 68 | Temp 98.6°F | Resp 14 | Ht 67.0 in | Wt 192.0 lb

## 2016-05-27 DIAGNOSIS — F411 Generalized anxiety disorder: Secondary | ICD-10-CM

## 2016-05-27 MED ORDER — BUPROPION HCL ER (XL) 150 MG PO TB24: 150.0000 mg | ORAL_TABLET | Freq: Every day | ORAL | 5 refills | Status: DC

## 2016-05-27 NOTE — Progress Notes (Signed)
   Subjective:    Patient ID: Andrew Mosley, male    DOB: 12-Nov-1990, 25 y.o.   MRN: [id number]  HPI I have not seen the patient in several years. He has dealt with anxiety ever since he died of lymphoma 5 years ago. He is now been cleared by the cancer center and does not follow up with him on any basis. This is helped his anxiety tremendously. We tried Lexapro and Effexor with no benefit but he saw tremendous benefit from Wellbutrin extended release 150 mg daily. This was prescribed to him by Dr. in [place]. He would like a refill on this medication. However he now feels that he may be able to wean off of it as he doesn't feel that he needs it any longer. He denies any complications from the medication. He denies any seizure disorder. Past Medical History:  Diagnosis Date  . Anxiety   . Hodgkin disease ([place]) 02/07/2012  . Hodgkin's lymphoma ([place])    hodgkins lymphoma dx 01/2011   No past surgical history on file. No current outpatient prescriptions on file prior to visit.   No current facility-administered medications on file prior to visit.    No Known Allergies Social History   Social History  . Marital status: Single    Spouse name: N/A  . Number of children: N/A  . Years of education: N/A   Occupational History  . Not on file.   Social History Main Topics  . Smoking status: Never Smoker  . Smokeless tobacco: Never Used  . Alcohol use No  . Drug use: No  . Sexual activity: Not on file   Other Topics Concern  . Not on file   Social History Narrative  . No narrative on file      Review of Systems  All other systems reviewed and are negative.      Objective:   Physical Exam  Cardiovascular: Normal rate, regular rhythm and normal heart sounds.   No murmur heard. Pulmonary/Chest: Effort normal and breath sounds normal. No respiratory distress. He has no wheezes. He has no rales.  Abdominal: Soft. Bowel sounds are normal. He exhibits no distension. There is no  tenderness. There is no rebound and no guarding.  Lymphadenopathy:    He has no cervical adenopathy.  Skin: No rash noted. No erythema.  Vitals reviewed.         Assessment & Plan:  GAD (generalized anxiety disorder) - Plan: buPROPion (WELLBUTRIN XL) 150 MG 24 hr tablet  We will continue the Wellbutrin XL Tums daily. However recommended that the patient try breaking it in half and taking a half a pill a day for 2-3 weeks. If he suffers no ill effects, he can then move to a half a pill every other day for 2-3 weeks and then try discontinuing the medication altogether. He is interested in possibly trying this because he states that he is not having any issues with anxiety for 2 years

## 2016-07-23 ENCOUNTER — Other Ambulatory Visit: Payer: Self-pay | Admitting: Physician Assistant

## 2016-08-14 ENCOUNTER — Ambulatory Visit (INDEPENDENT_AMBULATORY_CARE_PROVIDER_SITE_OTHER): Payer: BLUE CROSS/BLUE SHIELD | Admitting: Physician Assistant

## 2016-08-14 VITALS — BP 110/79 | HR 93 | Temp 99.2°F | Resp 18 | Wt 187.0 lb

## 2016-08-14 DIAGNOSIS — J988 Other specified respiratory disorders: Secondary | ICD-10-CM

## 2016-08-14 DIAGNOSIS — B9789 Other viral agents as the cause of diseases classified elsewhere: Secondary | ICD-10-CM | POA: Diagnosis not present

## 2016-08-14 DIAGNOSIS — J020 Streptococcal pharyngitis: Secondary | ICD-10-CM | POA: Diagnosis not present

## 2016-08-14 DIAGNOSIS — R6889 Other general symptoms and signs: Secondary | ICD-10-CM

## 2016-08-14 LAB — STREP GROUP A AG, W/REFLEX TO CULT: STREGTOCOCCUS GROUP A AG SCREEN: NOT DETECTED

## 2016-08-14 LAB — INFLUENZA A AND B AG, IMMUNOASSAY
INFLUENZA A ANTIGEN: NOT DETECTED
Influenza B Antigen: NOT DETECTED

## 2016-08-14 NOTE — Progress Notes (Signed)
    Patient ID: Andrew Mosley MRN: [id number], DOB: Dec 28, 1990, 25 y.o. Date of Encounter: 08/14/2016, 11:00 AM    Chief Complaint:  Chief Complaint  Patient presents with  . congestion, body aches, fatigue,    sore throat, started monday     HPI: 25 y.o. year old male presents with above.   He states that Monday 08/12/16 after work he "just didn't feel good".  That night before bed, still wasn't feeling good. Then during the night he had some cough and congestion. Yesterday he had body aches and fever and slept all day. Says that he would wake up with sweats. He did not check his temperature. Just felt symptoms of fever.  Says that he just "feels terrible ". Has had some sore throat and headache. Some cough and some congestion.  Temperature here 99.2.     Home Meds:   Outpatient Medications Prior to Visit  Medication Sig Dispense Refill  . buPROPion (WELLBUTRIN XL) 150 MG 24 hr tablet Take 150 mg by mouth daily.     Marland Kitchen buPROPion (WELLBUTRIN XL) 150 MG 24 hr tablet Take 1 tablet (150 mg total) by mouth daily. (Patient not taking: Reported on 08/14/2016) 30 tablet 5   No facility-administered medications prior to visit.     Allergies: No Known Allergies    Review of Systems: See HPI for pertinent ROS. All other ROS negative.    Physical Exam: Blood pressure 110/79, pulse 93, temperature 99.2 F (37.3 C), temperature source Oral, resp. rate 18, weight 187 lb (84.8 kg), SpO2 98 %., Body mass index is 29.29 kg/m. General:  WNWD WM. Appears in no acute distress. HEENT: Normocephalic, atraumatic, eyes without discharge, sclera non-icteric, nares are without discharge. Bilateral auditory canals clear, TM's are without perforation, pearly grey and translucent with reflective cone of light bilaterally. Oral cavity moist, posterior pharynx with minimal erythema, no exudate, no peritonsillar abscess.  Neck: Supple. No thyromegaly. No lymphadenopathy. Lungs: Clear bilaterally to  auscultation without wheezes, rales, or rhonchi. Breathing is unlabored. Heart: Regular rhythm. No murmurs, rubs, or gallops. Msk:  Strength and tone normal for age. Extremities/Skin: Warm and dry. Neuro: Alert and oriented X 3. Moves all extremities spontaneously. Gait is normal. CNII-XII grossly in tact. Psych:  Responds to questions appropriately with a normal affect.     ASSESSMENT AND PLAN:  25 y.o. year old male with  1. Viral respiratory infection Rapid strep test negative. Flu test negative. Suspect that he has a viral illness. Told him to use Tylenol and Motrin for fever and body aches. Can use over-the-counter decongestants and cough medicines as needed for symptom relief. Discussed that this is contagious and he needs to stay out of work until he has been fever free for 24 hours. Offered him a note to cover him being out of work but he says he needs no note. Told him to follow-up with [place] if fever increases greater than 100 . Also follow-up if symptoms worsen significantly. As well, follow-up if symptoms not resolved in 7-10 days.  2. Streptococcal sore throat - STREP GROUP A AG, W/REFLEX TO CULT  3. Flu-like symptoms - Influenza A and B Ag, Immunoassay   Signed, [address] [place], [place], [facility] 08/14/2016 11:00 AM

## 2016-08-16 LAB — CULTURE, GROUP A STREP: Organism ID, Bacteria: NORMAL

## 2016-08-30 ENCOUNTER — Other Ambulatory Visit: Payer: Self-pay | Admitting: Family Medicine

## 2016-08-30 ENCOUNTER — Other Ambulatory Visit: Payer: BLUE CROSS/BLUE SHIELD

## 2016-08-30 DIAGNOSIS — Z08 Encounter for follow-up examination after completed treatment for malignant neoplasm: Secondary | ICD-10-CM

## 2016-08-30 DIAGNOSIS — C8190 Hodgkin lymphoma, unspecified, unspecified site: Principal | ICD-10-CM

## 2016-08-30 DIAGNOSIS — Z0000 Encounter for general adult medical examination without abnormal findings: Secondary | ICD-10-CM

## 2016-08-30 LAB — COMPLETE METABOLIC PANEL WITH GFR
ALBUMIN: 4.5 g/dL (ref 3.6–5.1)
ALK PHOS: 43 U/L (ref 40–115)
ALT: 18 U/L (ref 9–46)
AST: 21 U/L (ref 10–40)
BILIRUBIN TOTAL: 0.5 mg/dL (ref 0.2–1.2)
BUN: 17 mg/dL (ref 7–25)
CALCIUM: 9.4 mg/dL (ref 8.6–10.3)
CO2: 25 mmol/L (ref 20–31)
Chloride: 105 mmol/L (ref 98–110)
Creat: 0.95 mg/dL (ref 0.60–1.35)
GFR, Est African American: >89 mL/min (ref >=60)
Glucose, Bld: 85 mg/dL (ref 70–99)
Potassium: 4.6 mmol/L (ref 3.5–5.3)
Sodium: 140 mmol/L (ref 135–146)
Total Protein: 6.6 g/dL (ref 6.1–8.1)

## 2016-08-31 LAB — CBC WITH DIFFERENTIAL/PLATELET
BASOS ABS: 0 {cells}/uL (ref 0–200)
Basophils Relative: 0 %
EOS PCT: 4 %
Eosinophils Absolute: 348 cells/uL (ref 15–500)
HCT: 56 % — ABNORMAL HIGH (ref 38.5–50.0)
Hemoglobin: 18.6 g/dL — ABNORMAL HIGH (ref 13.0–17.0)
Lymphocytes Relative: 32 %
Lymphs Abs: 2784 cells/uL (ref 850–3900)
MCH: 30 pg (ref 27.0–33.0)
MCHC: 33.2 g/dL (ref 32.0–36.0)
MCV: 90.5 fL (ref 80.0–100.0)
MONOS PCT: 7 %
MPV: 10.5 fL (ref 7.5–12.5)
Monocytes Absolute: 609 cells/uL (ref 200–950)
NEUTROS ABS: 4959 {cells}/uL (ref 1500–7800)
Neutrophils Relative %: 57 %
PLATELETS: 166 10*3/uL (ref 140–400)
RBC: 6.19 MIL/uL — ABNORMAL HIGH (ref 4.20–5.80)
RDW: 13.2 % (ref 11.0–15.0)
WBC: 8.7 10*3/uL (ref 3.8–10.8)

## 2016-09-04 ENCOUNTER — Other Ambulatory Visit: Payer: Self-pay | Admitting: Family Medicine

## 2016-09-04 DIAGNOSIS — D582 Other hemoglobinopathies: Secondary | ICD-10-CM

## 2016-10-29 ENCOUNTER — Encounter: Payer: Self-pay | Admitting: Family Medicine

## 2016-10-29 ENCOUNTER — Ambulatory Visit (INDEPENDENT_AMBULATORY_CARE_PROVIDER_SITE_OTHER): Payer: BLUE CROSS/BLUE SHIELD | Admitting: Family Medicine

## 2016-10-29 VITALS — BP 118/68 | HR 76 | Temp 98.8°F | Resp 14 | Ht 67.0 in | Wt 197.0 lb

## 2016-10-29 DIAGNOSIS — F411 Generalized anxiety disorder: Secondary | ICD-10-CM | POA: Diagnosis not present

## 2016-10-29 MED ORDER — BUPROPION HCL ER (XL) 150 MG PO TB24: 150.0000 mg | ORAL_TABLET | Freq: Every day | ORAL | 5 refills | Status: DC

## 2016-10-29 NOTE — Progress Notes (Signed)
   Subjective:    Patient ID: Andrew Mosley, male    DOB: [birth year], 26 y.o.   MRN: [id number]  HPI Please see my last few office visits. Patient has a history of intermittent anxiety. He has mild panic attacks almost on a daily basis. Panic attacks are characterized by tachycardia, sweating of his palms and soles, feeling short of breath, feeling extremely nervous. This compromises his ability to function at work. In the past she took Wellbutrin with good success at controlling his anxiety. He denies any depression or suicidal ideation. He is interested in resuming Wellbutrin try to help manage his anxiety Past Medical History:  Diagnosis Date  . Anxiety   . Hodgkin disease ([place]) 02/07/2012  . Hodgkin's lymphoma ([place])    hodgkins lymphoma dx 01/2011   No past surgical history on file. No current outpatient prescriptions on file prior to visit.   No current facility-administered medications on file prior to visit.    No Known Allergies Social History   Social History  . Marital status: Single    Spouse name: N/A  . Number of children: N/A  . Years of education: N/A   Occupational History  . Not on file.   Social History Main Topics  . Smoking status: Never Smoker  . Smokeless tobacco: Never Used  . Alcohol use No  . Drug use: No  . Sexual activity: Not on file   Other Topics Concern  . Not on file   Social History Narrative  . No narrative on file      Review of Systems  All other systems reviewed and are negative.      Objective:   Physical Exam  Cardiovascular: Normal rate, regular rhythm and normal heart sounds.   No murmur heard. Pulmonary/Chest: Effort normal and breath sounds normal. No respiratory distress. He has no wheezes. He has no rales.  Abdominal: Soft. Bowel sounds are normal. He exhibits no distension. There is no tenderness. There is no rebound and no guarding.  Lymphadenopathy:    He has no cervical adenopathy.  Skin: No rash noted. No  erythema.  Vitals reviewed.         Assessment & Plan:  GAD (generalized anxiety disorder) - Plan: buPROPion (WELLBUTRIN XL) 150 MG 24 hr tablet Resume wellbutrin xl 150 mg poqday and recheck in 1 month.

## 2016-11-25 ENCOUNTER — Other Ambulatory Visit: Payer: Self-pay | Admitting: *Deleted

## 2016-11-25 DIAGNOSIS — F411 Generalized anxiety disorder: Secondary | ICD-10-CM

## 2016-11-25 MED ORDER — BUPROPION HCL ER (XL) 150 MG PO TB24: 150.0000 mg | ORAL_TABLET | Freq: Every day | ORAL | 3 refills | Status: DC

## 2017-07-23 ENCOUNTER — Ambulatory Visit (INDEPENDENT_AMBULATORY_CARE_PROVIDER_SITE_OTHER): Payer: BLUE CROSS/BLUE SHIELD | Admitting: Family Medicine

## 2017-07-23 ENCOUNTER — Encounter: Payer: Self-pay | Admitting: Family Medicine

## 2017-07-23 VITALS — BP 100/62 | HR 100 | Temp 101.0°F | Resp 12 | Ht 67.0 in | Wt 190.0 lb

## 2017-07-23 DIAGNOSIS — J069 Acute upper respiratory infection, unspecified: Secondary | ICD-10-CM | POA: Diagnosis not present

## 2017-07-23 DIAGNOSIS — R509 Fever, unspecified: Secondary | ICD-10-CM | POA: Diagnosis not present

## 2017-07-23 LAB — INFLUENZA A AND B AG, IMMUNOASSAY
INFLUENZA A ANTIGEN: NOT DETECTED
INFLUENZA B ANTIGEN: NOT DETECTED

## 2017-07-23 LAB — CBC
HCT: 42.3 % (ref 38.5–50.0)
HEMOGLOBIN: 15.4 g/dL (ref 13.2–17.1)
MCH: 31.8 pg (ref 27.0–33.0)
MCHC: 36.4 g/dL — ABNORMAL HIGH (ref 32.0–36.0)
MCV: 87.2 fL (ref 80.0–100.0)
Platelets: 167 10*3/uL (ref 140–400)
RBC: 4.85 10*6/uL (ref 4.20–5.80)
RDW: 12.2 % (ref 11.0–15.0)
WBC: 12.2 10*3/uL — ABNORMAL HIGH (ref 3.8–10.8)

## 2017-07-23 NOTE — Patient Instructions (Signed)
F/U December/jan for full physical with Dr. Dennard Schaumann

## 2017-07-23 NOTE — Progress Notes (Signed)
   Subjective:    Patient ID: Andrew Mosley, male    DOB: 1991/01/10, 26 y.o.   MRN: [id number]  Patient presents for Chills/fever - st, cough, congestion  Pt here with fever, chills,body aches, congestion,sore throat  started yesterday. Took OTC cold/flu medication. Decreased appetite, no known sick contacts. No N/V/DIarrhea. Felt good yesterday morning.  History of Non hodkins Lymphoma.     Review Of Systems:  GEN- + fatigue, +fever, weight loss,weakness, recent illness HEENT- denies eye drainage, change in vision, +nasal discharge, CVS- denies chest pain, palpitations RESP- denies SOB,+ cough, wheeze ABD- denies N/V, change in stools, abd pain GU- denies dysuria, hematuria, dribbling, incontinence MSK- denies joint pain, +muscle aches, injury Neuro- denies headache, dizziness, syncope, seizure activity       Objective:    BP 100/62   Pulse 100   Temp (!) 101 F (38.3 C) (Oral)   Resp 12   Ht 5\' 7"  (1.702 m)   Wt 190 lb (86.2 kg)   SpO2 98%   BMI 29.76 kg/m  GEN- NAD, alert and oriented x3, non toxic appearing,febrile HEENT- PERRL, EOMI, non injected sclera, pink conjunctiva, MMM, oropharynx clear Neck- Supple, no LAD CVS- mild tachycardia , no murmur RESP-CTAB ABD-NABS,soft,NT,ND EXT- No edema Pulses- Radial 2+        Assessment & Plan:    ibuprofen given in office for fever    Problem List Items Addressed This Visit    None    Visit Diagnoses    Viral URI    -  Primary   Viral illness, neg flu/strep, WBC normal reponse to viral mild elevation at 12,000, Hb normal. Given red flags, fluids, rest    Relevant Orders   STREP GROUP A AG, W/REFLEX TO CULT (Completed)   Fever, unspecified fever cause       Relevant Orders   CBC (Completed)   Influenza A and B Ag, Immunoassay (Completed)      Note: This dictation was prepared with Dragon dictation along with smaller phrase technology. Any transcriptional errors that result from this process are  unintentional.

## 2017-07-24 LAB — CULTURE, GROUP A STREP
MICRO NUMBER: 81097970
SPECIMEN QUALITY: ADEQUATE

## 2017-07-24 LAB — STREP GROUP A AG, W/REFLEX TO CULT: STREPTOCOCCUS, GROUP A SCREEN (DIRECT): NOT DETECTED

## 2017-10-03 ENCOUNTER — Encounter: Payer: Self-pay | Admitting: Family Medicine

## 2017-10-03 ENCOUNTER — Other Ambulatory Visit: Payer: BLUE CROSS/BLUE SHIELD

## 2017-10-03 ENCOUNTER — Ambulatory Visit (INDEPENDENT_AMBULATORY_CARE_PROVIDER_SITE_OTHER): Payer: BLUE CROSS/BLUE SHIELD | Admitting: Family Medicine

## 2017-10-03 VITALS — BP 128/68 | HR 64 | Temp 98.6°F | Resp 14 | Ht 67.0 in | Wt 191.0 lb

## 2017-10-03 DIAGNOSIS — Z0000 Encounter for general adult medical examination without abnormal findings: Secondary | ICD-10-CM

## 2017-10-03 DIAGNOSIS — Z23 Encounter for immunization: Secondary | ICD-10-CM

## 2017-10-03 LAB — LIPID PANEL
Cholesterol: 153 mg/dL (ref <200)
HDL: 45 mg/dL (ref >40)
LDL Cholesterol (Calc): 85 mg/dL (calc)
Non-HDL Cholesterol (Calc): 108 mg/dL (calc) (ref <130)
Total CHOL/HDL Ratio: 3.4 (calc) (ref <5.0)
Triglycerides: 132 mg/dL (ref <150)

## 2017-10-03 LAB — CBC WITH DIFFERENTIAL/PLATELET
Basophils Absolute: 58 cells/uL (ref 0–200)
Basophils Relative: 1 %
EOS ABS: 203 {cells}/uL (ref 15–500)
Eosinophils Relative: 3.5 %
HCT: 45.4 % (ref 38.5–50.0)
Hemoglobin: 15.6 g/dL (ref 13.2–17.1)
Lymphs Abs: 1560 cells/uL (ref 850–3900)
MCH: 30.2 pg (ref 27.0–33.0)
MCHC: 34.4 g/dL (ref 32.0–36.0)
MCV: 87.8 fL (ref 80.0–100.0)
MPV: 11.3 fL (ref 7.5–12.5)
Monocytes Relative: 9 %
NEUTROS PCT: 59.6 %
Neutro Abs: 3457 cells/uL (ref 1500–7800)
PLATELETS: 230 10*3/uL (ref 140–400)
RBC: 5.17 10*6/uL (ref 4.20–5.80)
RDW: 12.2 % (ref 11.0–15.0)
TOTAL LYMPHOCYTE: 26.9 %
WBC mixed population: 522 cells/uL (ref 200–950)
WBC: 5.8 10*3/uL (ref 3.8–10.8)

## 2017-10-03 LAB — COMPLETE METABOLIC PANEL WITH GFR
AG RATIO: 2 (calc) (ref 1.0–2.5)
ALT: 18 U/L (ref 9–46)
AST: 24 U/L (ref 10–40)
Albumin: 4.7 g/dL (ref 3.6–5.1)
Alkaline phosphatase (APISO): 40 U/L (ref 40–115)
BILIRUBIN TOTAL: 0.9 mg/dL (ref 0.2–1.2)
BUN: 12 mg/dL (ref 7–25)
CHLORIDE: 105 mmol/L (ref 98–110)
CO2: 28 mmol/L (ref 20–32)
Calcium: 9.8 mg/dL (ref 8.6–10.3)
Creat: 1.05 mg/dL (ref 0.60–1.35)
GFR, Est African American: 112 mL/min/{1.73_m2} (ref >=60)
GFR, Est Non African American: 97 mL/min/{1.73_m2} (ref >=60)
Globulin: 2.4 g/dL (calc) (ref 1.9–3.7)
Glucose, Bld: 99 mg/dL (ref 65–99)
POTASSIUM: 4.9 mmol/L (ref 3.5–5.3)
Sodium: 139 mmol/L (ref 135–146)
Total Protein: 7.1 g/dL (ref 6.1–8.1)

## 2017-10-03 NOTE — Progress Notes (Signed)
Subjective:    Patient ID: Andrew Mosley, male    DOB: [birth year], 26 y.o.   MRN: [id number]  HPI  10/2016 Please see my last few office visits. Patient has a history of intermittent anxiety. He has mild panic attacks almost on a daily basis. Panic attacks are characterized by tachycardia, sweating of his palms and soles, feeling short of breath, feeling extremely nervous. This compromises his ability to function at work. In the past she took Wellbutrin with good success at controlling his anxiety. He denies any depression or suicidal ideation. He is interested in resuming Wellbutrin try to help manage his anxiety.  At that time, my plan was: Resume wellbutrin xl 150 mg poqday and recheck in 1 month.   10/03/17 Patient is here today for a complete physical exam.  He is doing much better on Wellbutrin.  He states that his anxiety is under good control.  He has no desire however to stop the medication.  He states that he has been doing very well from the standpoint of panic attacks now for more than 6 months.  He is able to manage his anxiety without requiring Xanax or having overt panic attacks.  Past medical history is significant for Hodgkin's lymphoma which was treated with ABVD chemotherapy.  However he denies any symptoms consistent with cardiomyopathy.  He also denies any symptoms consistent with pulmonary fibrosis or interstitial pneumonitis.  He denies any chest pain shortness of breath dyspnea on exertion orthopnea or pleurisy.  He is due for a flu shot today as well as screening lab work Past Medical History:  Diagnosis Date  . Anxiety   . Hodgkin disease ([place]) 02/07/2012  . Hodgkin's lymphoma ([place])    hodgkins lymphoma dx 01/2011   No past surgical history on file. Current Outpatient Medications on File Prior to Visit  Medication Sig Dispense Refill  . buPROPion (WELLBUTRIN XL) 150 MG 24 hr tablet Take 1 tablet (150 mg total) by mouth daily. 90 tablet 3   No current  facility-administered medications on file prior to visit.    No Known Allergies Social History   Socioeconomic History  . Marital status: Single    Spouse name: Not on file  . Number of children: Not on file  . Years of education: Not on file  . Highest education level: Not on file  Social Needs  . Financial resource strain: Not on file  . Food insecurity - worry: Not on file  . Food insecurity - inability: Not on file  . Transportation needs - medical: Not on file  . Transportation needs - non-medical: Not on file  Occupational History  . Not on file  Tobacco Use  . Smoking status: Never Smoker  . Smokeless tobacco: Never Used  Substance and Sexual Activity  . Alcohol use: No  . Drug use: No  . Sexual activity: Not on file  Other Topics Concern  . Not on file  Social History Narrative  . Not on file    Family History  Problem Relation Age of Onset  . Cancer Mother        melanoma  . Cancer Maternal Grandmother        breast ca     Review of Systems  All other systems reviewed and are negative.      Objective:   Physical Exam  Constitutional: He is oriented to person, place, and time. He appears well-developed and well-nourished. No distress.  HENT:  Head: Normocephalic and atraumatic.  Right Ear: External ear normal.  Left Ear: External ear normal.  Nose: Nose normal.  Mouth/Throat: Oropharynx is clear and moist. No oropharyngeal exudate.  Eyes: Conjunctivae and EOM are normal. Pupils are equal, round, and reactive to light. Right eye exhibits no discharge. Left eye exhibits no discharge. No scleral icterus.  Neck: Normal range of motion. Neck supple. No JVD present. No tracheal deviation present. No thyromegaly present.  Cardiovascular: Normal rate, regular rhythm and normal heart sounds. Exam reveals no gallop and no friction rub.  No murmur heard. Pulmonary/Chest: Effort normal and breath sounds normal. No stridor. No respiratory distress. He has no  wheezes. He has no rales. He exhibits no tenderness.  Abdominal: Soft. Bowel sounds are normal. He exhibits no distension. There is no tenderness. There is no rebound and no guarding.  Musculoskeletal: Normal range of motion. He exhibits no edema, tenderness or deformity.  Lymphadenopathy:    He has no cervical adenopathy.  Neurological: He is alert and oriented to person, place, and time. He has normal reflexes. He displays normal reflexes. No cranial nerve deficit. He exhibits normal muscle tone. Coordination normal.  Skin: No rash noted. He is not diaphoretic. No erythema.  Psychiatric: He has a normal mood and affect. His behavior is normal. Judgment and thought content normal.  Vitals reviewed.         Assessment & Plan:  General medical exam - Plan: CBC with Differential/Platelet, COMPLETE METABOLIC PANEL WITH GFR, Lipid panel  Needs flu shot - Plan: Flu Vaccine QUAD 36+ mos IM His physical exam is completely normal.  He received his flu shot today.  I will screen for hyperlipidemia with a cholesterol panel.  I will also check a baseline CBC and CMP.  The remainder of his preventative care is up-to-date.  I continue to encourage regular aerobic exercise and a well-balanced diet rich in fruits and vegetables and low in saturated fat.  Continue Wellbutrin at the present time as his anxiety is well controlled and the patient has no desire to try to wean off the medication at this point

## 2017-10-08 ENCOUNTER — Encounter: Payer: Self-pay | Admitting: Family Medicine

## 2018-01-24 ENCOUNTER — Other Ambulatory Visit: Payer: Self-pay | Admitting: Family Medicine

## 2018-01-24 DIAGNOSIS — F411 Generalized anxiety disorder: Secondary | ICD-10-CM
